# Patient Record
Sex: Female | Born: 1955 | Race: White | Hispanic: No | Marital: Married | Smoking: Never smoker
Health system: Southern US, Community
[De-identification: ages and names within clinical notes are randomized; demographics above are authoritative.]

## PROBLEM LIST (undated history)

## (undated) DIAGNOSIS — F4310 Post-traumatic stress disorder, unspecified: Secondary | ICD-10-CM

## (undated) DIAGNOSIS — G8929 Other chronic pain: Secondary | ICD-10-CM

## (undated) DIAGNOSIS — F32A Depression, unspecified: Secondary | ICD-10-CM

## (undated) DIAGNOSIS — T2125XA Burn of second degree of buttock, initial encounter: Secondary | ICD-10-CM

## (undated) DIAGNOSIS — E119 Type 2 diabetes mellitus without complications: Secondary | ICD-10-CM

## (undated) DIAGNOSIS — F429 Obsessive-compulsive disorder, unspecified: Secondary | ICD-10-CM

## (undated) DIAGNOSIS — F329 Major depressive disorder, single episode, unspecified: Secondary | ICD-10-CM

## (undated) DIAGNOSIS — I872 Venous insufficiency (chronic) (peripheral): Secondary | ICD-10-CM

## (undated) DIAGNOSIS — J45909 Unspecified asthma, uncomplicated: Secondary | ICD-10-CM

## (undated) DIAGNOSIS — F410 Panic disorder [episodic paroxysmal anxiety] without agoraphobia: Secondary | ICD-10-CM

## (undated) DIAGNOSIS — I10 Essential (primary) hypertension: Secondary | ICD-10-CM

## (undated) DIAGNOSIS — G5600 Carpal tunnel syndrome, unspecified upper limb: Secondary | ICD-10-CM

## (undated) DIAGNOSIS — G2581 Restless legs syndrome: Secondary | ICD-10-CM

## (undated) DIAGNOSIS — M1990 Unspecified osteoarthritis, unspecified site: Secondary | ICD-10-CM

## (undated) DIAGNOSIS — R161 Splenomegaly, not elsewhere classified: Secondary | ICD-10-CM

## (undated) DIAGNOSIS — Z87442 Personal history of urinary calculi: Secondary | ICD-10-CM

## (undated) DIAGNOSIS — E785 Hyperlipidemia, unspecified: Secondary | ICD-10-CM

## (undated) DIAGNOSIS — Z136 Encounter for screening for cardiovascular disorders: Secondary | ICD-10-CM

## (undated) HISTORY — DX: Restless legs syndrome: G25.81

## (undated) HISTORY — DX: Depression, unspecified: F32.A

## (undated) HISTORY — PX: OVARIAN CYST SURGERY: SHX726

## (undated) HISTORY — DX: Hyperlipidemia, unspecified: E78.5

## (undated) HISTORY — PX: HAND SURGERY: SHX662

## (undated) HISTORY — DX: Essential (primary) hypertension: I10

## (undated) HISTORY — PX: BREAST EXCISIONAL BIOPSY: SUR124

## (undated) HISTORY — PX: KNEE SURGERY: SHX244

## (undated) HISTORY — DX: Encounter for screening for cardiovascular disorders: Z13.6

## (undated) HISTORY — PX: TONSILLECTOMY: SUR1361

## (undated) HISTORY — DX: Panic disorder (episodic paroxysmal anxiety): F41.0

## (undated) HISTORY — DX: Major depressive disorder, single episode, unspecified: F32.9

---

## 1982-02-21 HISTORY — PX: ABDOMINAL HYSTERECTOMY: SHX81

## 1999-11-05 ENCOUNTER — Encounter: Payer: Self-pay | Admitting: Urology

## 1999-11-05 ENCOUNTER — Encounter: Admission: RE | Admit: 1999-11-05 | Discharge: 1999-11-05 | Payer: Self-pay | Admitting: Urology

## 2005-09-16 ENCOUNTER — Other Ambulatory Visit: Payer: Self-pay

## 2005-09-23 ENCOUNTER — Ambulatory Visit: Payer: Self-pay | Admitting: Specialist

## 2006-03-15 ENCOUNTER — Ambulatory Visit: Payer: Self-pay | Admitting: Endocrinology

## 2007-02-22 DIAGNOSIS — Z136 Encounter for screening for cardiovascular disorders: Secondary | ICD-10-CM

## 2007-02-22 HISTORY — PX: BREAST LUMPECTOMY: SHX2

## 2007-02-22 HISTORY — DX: Encounter for screening for cardiovascular disorders: Z13.6

## 2007-12-14 ENCOUNTER — Observation Stay: Payer: Self-pay | Admitting: Internal Medicine

## 2008-05-20 ENCOUNTER — Emergency Department: Payer: Self-pay | Admitting: Emergency Medicine

## 2009-07-15 DIAGNOSIS — E785 Hyperlipidemia, unspecified: Secondary | ICD-10-CM | POA: Insufficient documentation

## 2009-09-02 ENCOUNTER — Emergency Department: Payer: Self-pay | Admitting: Emergency Medicine

## 2009-11-10 DIAGNOSIS — M1990 Unspecified osteoarthritis, unspecified site: Secondary | ICD-10-CM | POA: Insufficient documentation

## 2009-12-21 DIAGNOSIS — F339 Major depressive disorder, recurrent, unspecified: Secondary | ICD-10-CM | POA: Insufficient documentation

## 2009-12-21 DIAGNOSIS — F429 Obsessive-compulsive disorder, unspecified: Secondary | ICD-10-CM | POA: Insufficient documentation

## 2009-12-21 DIAGNOSIS — F4310 Post-traumatic stress disorder, unspecified: Secondary | ICD-10-CM | POA: Insufficient documentation

## 2010-01-06 ENCOUNTER — Ambulatory Visit: Payer: Self-pay

## 2010-05-31 ENCOUNTER — Emergency Department: Payer: Self-pay | Admitting: Internal Medicine

## 2011-04-05 ENCOUNTER — Ambulatory Visit: Payer: Self-pay

## 2011-10-17 LAB — COMPREHENSIVE METABOLIC PANEL
Albumin: 4.3 g/dL (ref 3.4–5.0)
Alkaline Phosphatase: 122 U/L (ref 50–136)
Anion Gap: 13 (ref 7–16)
BUN: 20 mg/dL — ABNORMAL HIGH (ref 7–18)
Bilirubin,Total: 0.9 mg/dL (ref 0.2–1.0)
Calcium, Total: 9.3 mg/dL (ref 8.5–10.1)
Chloride: 100 mmol/L (ref 98–107)
Co2: 27 mmol/L (ref 21–32)
Creatinine: 0.84 mg/dL (ref 0.60–1.30)
EGFR (African American): >60
EGFR (Non-African Amer.): >60
Glucose: 93 mg/dL (ref 65–99)
Osmolality: 282 (ref 275–301)
Potassium: 3.5 mmol/L (ref 3.5–5.1)
SGOT(AST): 128 U/L — ABNORMAL HIGH (ref 15–37)
SGPT (ALT): 89 U/L — ABNORMAL HIGH (ref 12–78)
Sodium: 140 mmol/L (ref 136–145)
Total Protein: 7.9 g/dL (ref 6.4–8.2)

## 2011-10-17 LAB — TROPONIN I
Troponin-I: <0.02 ng/mL
Troponin-I: <0.02 ng/mL
Troponin-I: <0.02 ng/mL

## 2011-10-17 LAB — CBC
HCT: 43.9 % (ref 35.0–47.0)
HGB: 15.1 g/dL (ref 12.0–16.0)
MCH: 30.8 pg (ref 26.0–34.0)
MCHC: 34.4 g/dL (ref 32.0–36.0)
MCV: 90 fL (ref 80–100)
Platelet: 186 10*3/uL (ref 150–440)
RBC: 4.9 10*6/uL (ref 3.80–5.20)
RDW: 12.8 % (ref 11.5–14.5)
WBC: 12.4 10*3/uL — ABNORMAL HIGH (ref 3.6–11.0)

## 2011-10-17 LAB — LIPASE, BLOOD: Lipase: 232 U/L (ref 73–393)

## 2011-10-18 DIAGNOSIS — R079 Chest pain, unspecified: Secondary | ICD-10-CM

## 2011-10-18 LAB — TROPONIN I: Troponin-I: <0.02 ng/mL

## 2011-10-19 ENCOUNTER — Inpatient Hospital Stay: Payer: Self-pay | Admitting: Internal Medicine

## 2011-10-19 LAB — CBC WITH DIFFERENTIAL/PLATELET
Basophil %: 0.8 %
Eosinophil %: 4.6 %
HCT: 38.6 % (ref 35.0–47.0)
Lymphocyte #: 0.8 10*3/uL — ABNORMAL LOW (ref 1.0–3.6)
Lymphocyte %: 8.5 %
MCV: 90 fL (ref 80–100)
Monocyte %: 15.4 %
Neutrophil #: 6.8 10*3/uL — ABNORMAL HIGH (ref 1.4–6.5)
Platelet: 149 10*3/uL — ABNORMAL LOW (ref 150–440)
RBC: 4.3 10*6/uL (ref 3.80–5.20)
WBC: 9.6 10*3/uL (ref 3.6–11.0)

## 2011-10-19 LAB — COMPREHENSIVE METABOLIC PANEL
Albumin: 3.3 g/dL — ABNORMAL LOW (ref 3.4–5.0)
Alkaline Phosphatase: 220 U/L — ABNORMAL HIGH (ref 50–136)
Anion Gap: 10 (ref 7–16)
BUN: 7 mg/dL (ref 7–18)
Chloride: 105 mmol/L (ref 98–107)
Co2: 24 mmol/L (ref 21–32)
Creatinine: 0.71 mg/dL (ref 0.60–1.30)
Glucose: 114 mg/dL — ABNORMAL HIGH (ref 65–99)
Potassium: 3.5 mmol/L (ref 3.5–5.1)
SGOT(AST): 377 U/L — ABNORMAL HIGH (ref 15–37)
SGPT (ALT): 519 U/L — ABNORMAL HIGH (ref 12–78)
Total Protein: 6.6 g/dL (ref 6.4–8.2)

## 2011-10-19 LAB — BILIRUBIN, DIRECT: Bilirubin, Direct: 3.4 mg/dL — ABNORMAL HIGH (ref 0.00–0.20)

## 2011-10-20 LAB — PLATELET COUNT: Platelet: 141 10*3/uL — ABNORMAL LOW (ref 150–440)

## 2011-10-20 LAB — HEPATIC FUNCTION PANEL A (ARMC)
Albumin: 3.4 g/dL (ref 3.4–5.0)
Bilirubin,Total: 2 mg/dL — ABNORMAL HIGH (ref 0.2–1.0)
Total Protein: 6.8 g/dL (ref 6.4–8.2)

## 2011-10-21 LAB — CBC WITH DIFFERENTIAL/PLATELET
Basophil #: 0.1 10*3/uL (ref 0.0–0.1)
Eosinophil #: 0 10*3/uL (ref 0.0–0.7)
HCT: 40.5 % (ref 35.0–47.0)
Lymphocyte #: 1.1 10*3/uL (ref 1.0–3.6)
MCH: 32 pg (ref 26.0–34.0)
MCHC: 35.2 g/dL (ref 32.0–36.0)
MCV: 91 fL (ref 80–100)
Monocyte #: 1.6 x10 3/mm — ABNORMAL HIGH (ref 0.2–0.9)
Neutrophil #: 17.7 10*3/uL — ABNORMAL HIGH (ref 1.4–6.5)
RDW: 13.2 % (ref 11.5–14.5)

## 2011-10-21 LAB — HEPATIC FUNCTION PANEL A (ARMC)
Albumin: 3.4 g/dL (ref 3.4–5.0)
Alkaline Phosphatase: 214 U/L — ABNORMAL HIGH (ref 50–136)
Bilirubin,Total: 1.1 mg/dL — ABNORMAL HIGH (ref 0.2–1.0)
SGOT(AST): 155 U/L — ABNORMAL HIGH (ref 15–37)
SGPT (ALT): 336 U/L — ABNORMAL HIGH (ref 12–78)
Total Protein: 7.4 g/dL (ref 6.4–8.2)

## 2011-10-23 LAB — CULTURE, BLOOD (SINGLE)

## 2011-10-25 ENCOUNTER — Encounter: Payer: Self-pay | Admitting: Cardiovascular Disease

## 2011-10-25 LAB — CANCER ANTIGEN 19-9: CA 19-9: 125 U/mL — ABNORMAL HIGH (ref 0–35)

## 2011-10-25 LAB — PATHOLOGY REPORT

## 2011-11-24 ENCOUNTER — Encounter: Payer: Self-pay | Admitting: Cardiovascular Disease

## 2012-05-28 ENCOUNTER — Ambulatory Visit: Payer: Self-pay

## 2012-06-07 DIAGNOSIS — N200 Calculus of kidney: Secondary | ICD-10-CM | POA: Insufficient documentation

## 2012-07-11 ENCOUNTER — Ambulatory Visit: Payer: Self-pay

## 2012-07-26 ENCOUNTER — Emergency Department: Payer: Self-pay | Admitting: Emergency Medicine

## 2012-11-01 DIAGNOSIS — K219 Gastro-esophageal reflux disease without esophagitis: Secondary | ICD-10-CM | POA: Insufficient documentation

## 2013-05-21 DIAGNOSIS — G2581 Restless legs syndrome: Secondary | ICD-10-CM | POA: Insufficient documentation

## 2013-06-10 DIAGNOSIS — G5600 Carpal tunnel syndrome, unspecified upper limb: Secondary | ICD-10-CM | POA: Insufficient documentation

## 2013-07-24 DIAGNOSIS — G4700 Insomnia, unspecified: Secondary | ICD-10-CM | POA: Insufficient documentation

## 2013-08-04 DIAGNOSIS — F1120 Opioid dependence, uncomplicated: Secondary | ICD-10-CM | POA: Insufficient documentation

## 2013-08-04 DIAGNOSIS — F1320 Sedative, hypnotic or anxiolytic dependence, uncomplicated: Secondary | ICD-10-CM | POA: Insufficient documentation
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
## 2014-06-10 NOTE — H&P (Signed)
PATIENT NAME:  Laurie [name], [name] MR#:  [id number] DATE OF BIRTH:  December 04, 1955  DATE OF ADMISSION:  10/18/2011  REFERRING PHYSICIAN: Lowella FairyJohn Woodruff, MD  PRIMARY CARE PHYSICIAN: [facility]   CHIEF COMPLAINT: Chest pain, sweating, and shortness of breath.   HISTORY OF PRESENT ILLNESS: This is a 59 year old female with significant past medical history of hyperlipidemia, diet-controlled diabetes, depression, and panic attacks who presents with complaints of chest pain. The patient reports chest pain developed this afternoon with sudden onset which was constant, no relieving or provoking factors, as well as it was followed by nausea and vomiting x2, as well had episode of sweating and shortness of breath. The patient reports her chest pain is much improved at this point. The patient usually takes aspirin 81 mg daily. The patient was given 324 mg of aspirin in the ED. The patient had negative stress test done in 2009 for similar complaints of chest pain. The patient's EKG did not show any ST or T wave changes, was essentially normal. The patient had troponin negative x2. The patient had borderline d-dimer at 0.55. The patient has allergy to IV dye so CT chest angiogram to rule out PE could not be done. The patient was given one treatment dose of Lovenox in the ED until she is able to get a V/Q scan done in the a.m. to rule out PE.   PAST MEDICAL HISTORY:  1. Hyperlipidemia.  2. Diet-controlled diabetes mellitus.  3. Depression.  4. Panic attacks.  5. Restless leg syndrome.  6. Hypertension.   PAST SURGICAL HISTORY:  1. Left breast lumpectomy in 2009.  2. Left knee surgery.  3. Right hand surgery.  4. Heel spurs removed from her feet bilaterally.  5. Hysterectomy in 1984.  6. Tonsillectomy.   ALLERGIES: IV dye, sulfa drugs, and latex.   HOME MEDICATIONS: 1. Sertraline 100 mg 3 tablets oral daily.  2. Risperidone 2 mg oral at bedtime.  3. Risperidone 0.5 mg daily.  4. Pravastatin 40 mg  at bedtime.  5. Omeprazole 20 mg daily.  6. Fluoxetine 20 mg twice a day. 7. Clonazepam 1 mg daily at bedtime.  8. Aspirin 81 mg daily.  FAMILY HISTORY: Significant for coronary artery disease in her father's side, a brother has lymphoma.   SOCIAL HISTORY: Negative for tobacco, alcohol, or illicit drug use.   REVIEW OF SYSTEMS: The patient denies any fever, fatigue, or weakness. EYES: Denies blurry vision, double vision, or pain. ENT: Denies tinnitus, ear pain, or hearing loss. RESPIRATORY: Denies any cough, wheezing, or hemoptysis. Has complaints of mild dyspnea. Denies any asthma or chronic obstructive pulmonary disease. CARDIOVASCULAR: Complains of midsternal chest pain. Denies any orthopnea, edema, arrhythmia, palpitations, syncope, or altered mental status. GASTROINTESTINAL: Complains of nausea and vomiting. Denies any diarrhea, abdominal pain, hematemesis, or melena. GENITOURINARY: Denies dysuria, hematuria, or renal colic. ENDOCRINE: Denies polyuria, polydipsia, or heat or cold intolerance. HEMATOLOGY: Denies any anemia, easy bruising, or bleeding diathesis. INTEGUMENTARY: Denies any acne, rash, or itching. MUSCULOSKELETAL: Denies any neck pain, shoulder pain, knee pain, swelling, or gout. NEUROLOGIC: Denies any numbness, dysarthria, epilepsy, tremors, or vertigo. PSYCH: Has history of anxiety and depression. Denies schizophrenia.   PHYSICAL EXAMINATION:   VITAL SIGNS: Temperature 96.1, pulse 78, respiratory rate 20, blood pressure 138/69, and saturating 99% on room air.   GENERAL: Obese female who looks comfortable in bed, in no apparent distress.   HEENT: Head atraumatic, normocephalic. Pupils are equal and reactive to light. Pink conjunctivae. Anicteric sclerae. Moist  oral mucosa.   NECK: Supple. No thyromegaly. No JVD.   CHEST: Good air entry bilaterally. No wheezing, rales, or rhonchi. Has reproducible tenderness in the mid sternal chest wall.   CARDIOVASCULAR: S1 and S2 heard. No  rubs, murmurs, or gallops.   ABDOMEN: Soft, nontender, and nondistended. Bowel sounds present. Obese.   EXTREMITIES: No edema. No clubbing. No cyanosis.   PSYCHIATRIC: Appropriate affect. Awake and alert x3. Intact judgment and insight.   NEUROLOGIC: Cranial nerves grossly intact. Motor 5 out of 5 in all extremities. Sensation nonfocal and symmetrical reflexes nonfocal.   PERTINENT LABS: Glucose 93, BUN 20, creatinine 0.84, sodium 140, potassium 3.5, chloride 100, and CO2 27. Troponin less than 0.02 x2. White blood cells 12.4, hemoglobin 15.1, hematocrit 43.9, and platelets 186. D-dimer 0.55.  ASSESSMENT AND PLAN:  1. Chest pain. The patient had negative troponin x2 without any significant EKG changes and had a negative stress test four years ago. We have already given aspirin, already is on statin. We will cycle troponins, total of three times. We will cycle one more troponin, and if negative, the patient can followup with her PCP or she can have a stress test done as an outpatient, but given the fact the patient had a mildly elevated d-dimer and unfortunately has IV dye allergy where she cannot have CT angiogram done to rule out PE, I discussed with the patient we will have her started on Lovenox treatment dose anticoagulation until V/Q scan is done in the a.m. The patient is aware of the plan and agrees with it. 2. Hyperlipidemia. We will continue with statin.  3. History of diet-controlled diabetes mellitus. Currently the patient's glucose is within normal limits. We will have her on Accu-Cheks and we will check a glycohemoglobin.  4. History of hypertension. Blood pressure is acceptable. Currently the patient is not on any medication. We will monitor.  5. Deep vein thrombosis prophylaxis: The patient received a treatment dose of Lovenox in ED.  6. GI: Prophylaxis. We will continue with Protonix.   CODE STATUS: THE PATIENT IS FULL CODE.   TOTAL TIME SPENT ON ADMISSION AND PATIENT CARE: 50  minutes.  ____________________________ Starleen Arms, MD dse:slb D: 10/18/2011 01:16:16 ET T: 10/18/2011 07:26:42 ET JOB#: [id number]  cc: Starleen Arms, MD, <Dictator> PCP - [facility]  Dakin Madani Teena Irani MD ELECTRONICALLY SIGNED 10/18/2011 22:18

## 2014-06-10 NOTE — Consult Note (Signed)
PATIENT NAME:  Laurie [name], Laurie [name] MR#:  [id number] DATE OF BIRTH:  Dec 16, 1955  DATE OF CONSULTATION:  10/20/2011  REFERRING PHYSICIAN:  Hospitalist Service CONSULTING PHYSICIAN:  Lurline Del, MD  REASON FOR CONSULTATION: Noncardiac chest pain.   HISTORY OF PRESENT ILLNESS: This is a 59 year old female with history of hyperlipidemia, diabetes, depression, and panic attacks who was admitted with chest pain which appears to be pretty noncardiac in origin. The chest pain was sudden in onset and was associated with nausea and vomiting. Cardiac work-up so far has been negative. Ultrasound showed a thick-walled gallbladder consistent with cholecystitis and liver enzymes were abnormal and bile ducts were dilated. Gastroenterology was consulted for possible cholecystitis/choledocholithiasis as the cause of her noncardiac chest pain. The patient was evaluated yesterday. She has no further chest pain. She is complaining of some upper abdominal discomfort. No further nausea or vomiting has been reported.   PAST MEDICAL HISTORY:  1. Hyperlipidemia. 2. Diabetes. 3. Depression. 4. Panic attack. 5. Restless leg syndrome.  6. Hypertension.   PAST SURGICAL HISTORY:  1. Left breast lumpectomy. 2. Left knee surgery.  3. Right hand surgery. 4. Hysterectomy.  5. Tonsillectomy.   ALLERGIES: IV dye, sulfa, and latex.  HOME MEDICATIONS: Sertraline, risperidone, pravastatin, omeprazole, fluoxetine, clonazepam, and aspirin.   FAMILY HISTORY: Significant for coronary artery disease. Brother has lymphoma.   SOCIAL HISTORY: She does not smoke or drink.   REVIEW OF SYSTEMS: Grossly negative except for what is mentioned in the History of Present Illness.   PHYSICAL EXAMINATION:   GENERAL: Well built female, somewhat obese, does not appear to be in any acute distress.   VITAL SIGNS: Temperature is 97 and she has been afebrile since admission, except for low grade fever of 99.1. Heart rate is in the 70s and  80s. Blood pressure is about 100/60. Clinically she appears to be mildly jaundiced.   NECK: Neck veins are flat.   LUNGS: Grossly clear to auscultation bilaterally with fair air entry and no added sounds.   CARDIOVASCULAR: Regular rate and rhythm. No gallops or murmurs.   ABDOMEN: Soft. Bowel signs are positive. Mild epigastric tenderness was noted. No significant rebound or guarding was noted. No right upper quadrant tenderness was noted.   EXTREMITIES: No edema, clubbing, or cyanosis.   NEUROLOGIC: Examination appears to be unremarkable.   LABORATORY, DIAGNOSTIC AND RADIOLOGIC DATA: White cell count is 12.4, hemoglobin 15, and hematocrit 186. Repeat white cell count is normal at 9.6. Troponins have been negative. Two days ago her liver enzymes were unremarkable with an ALT of 89 and AST of 128. Yesterday liver enzymes were grossly abnormal with a bilirubin of 4.5, alkaline phosphatase of 220, ALT of 519, and AST 377. Today bilirubin is down to 2. Alkaline phosphatase is down to 218, ALT is down to 386, and AST is down to 193. D-dimer is slightly elevated at 0.55.   V/Q scan shows low probability.  Ultrasound of the abdomen showed edematous wall of the gallbladder. Common bile duct is enlarged. No definite intrahepatic biliary ductal dilatation was noted. No Murphy's sign. Trace fluid was seen adjacent to the gallbladder.   ASSESSMENT AND PLAN: The patient is with probable cholecystitis although, she has no fever, her white cell count is normal, and no significant Murphy's sign was noted on examination. The patient also has distended common bile duct for which a MRCP was requested, which turned out to be negative for any stones, although the common bile duct does appear to be somewhat  enlarged at about 9 mm. Mild prominence of the pancreatic duct was noted as well, although no discrete pancreatic masses were noted. Due to the patient's intravenous pyelogram dye allergy an ERCP was not done and a  MRCP was done instead. The patient is currently awaiting cholecystectomy. I believe that an intraoperative cholangiogram will be done as well. We will wait for those results. I will obtain a CA-19-9 and the patient will eventually require a CT scan of the abdomen to rule out any pancreatic lesion as a cause of her biliary and pancreatic duct dilatation. Further recommendations to follow. Continue antibiotics. We will follow.  ____________________________ Lurline DelShaukat Aliena Ghrist, MD si:slb D: 10/20/2011 18:36:14 ET T: 10/21/2011 07:47:47 ET JOB#: [id number]  cc: Lurline DelShaukat Mildred Bollard, MD, <Dictator> Lurline DelSHAUKAT Juddson Cobern MD ELECTRONICALLY SIGNED 11/02/2011 11:02

## 2014-06-10 NOTE — Consult Note (Signed)
PATIENT NAME:  Laurie [name], Laurie [name] MR#:  [id number] DATE OF BIRTH:  1955/12/24  DATE OF CONSULTATION:  10/18/2011  REFERRING PHYSICIAN:  Katharina Caperima Vaickute, MD CONSULTING PHYSICIAN:  Emeree Mahler A. Kirke CorinArida, MD  REASON FOR CONSULTATION: Chest pain.   HISTORY OF PRESENT ILLNESS: This is a 59 year old Caucasian female with past medical history of hyperlipidemia, diet-controlled diabetes, depression, and panic attacks. She presented with sudden onset of chest pain which happened yesterday. It was substernal and described as sharp and was worse with a deep breath. It was associated with nausea and vomiting as well as sweating and shortness of breath. The patient currently feels better. She had chest pain in 2009 and was evaluated by a stress test. It was normal at that time. She has been noticing increased exertional dyspnea over the last two months. Her cardiac enzymes are negative during this admission. ECG showed no acute changes. D-dimer was mildly elevated. She is allergic to the IV dye and thus CT angiogram was not performed. She is waiting to have a V/Q scan done today.   PAST MEDICAL HISTORY:  1. Hyperlipidemia.  2. Diet-controlled diabetes.  3. Depression.  4. Panic attack.  5. Restless leg syndrome.  6. Hypertension.  7. Negative stress test in 2009.   ALLERGIES: IV dye, sulfa drugs, and latex.   HOME MEDICATIONS:  1. Sertraline 300 mg once daily.  2. Risperidone 2 mg at bedtime and 0.5 mg daily.  3. Pravastatin.  4. Omeprazole.  5. Fluoxetine.  6. Clonazepam.  7. Aspirin.   FAMILY HISTORY: Her father had a myocardial infarction.   SOCIAL HISTORY: Negative for smoking, alcohol, or recreational drug use.   REVIEW OF SYSTEMS: A ten-point review of systems was performed. It is negative other than what is mentioned in the history of present illness.   PHYSICAL EXAMINATION:   GENERAL: The patient appears to be at her stated age, in no acute distress.   VITAL SIGNS: Temperature 96.1, pulse  78, respiratory rate 20, blood pressure 138/69, and oxygen saturation is 99% on room air.   HEENT: Normocephalic, atraumatic.   NECK: No jugular venous distention or carotid bruits.   RESPIRATORY: Normal respiratory effort with no use of accessory muscles. Auscultation reveals normal breath sounds.   CARDIOVASCULAR: Normal PMI. Normal S1 and S2 with no gallops or murmurs.   ABDOMEN: Benign, nontender, and nondistended.   EXTREMITIES: No clubbing, cyanosis, or edema.   SKIN: Warm and dry with no rash.   PSYCHIATRIC: She is alert and oriented x3 with normal mood and affect.   LABORATORY AND DIAGNOSTIC DATA: Her labs were unremarkable, except for slightly elevated white cell count at 12.4. D-dimer was 0.55. Cardiac enzymes are negative.   ECG showed sinus rhythm with no significant ST or T wave changes.   IMPRESSION:  1. Pleuritic chest pain with dyspnea and mildly elevated d-dimer level.  2. Hypertension.  3. Hyperlipidemia.   RECOMMENDATIONS: The patient's chest pain is pleuritic in nature and does not seem to be angina. Given her associated symptoms and mildly elevated d-dimer level, I agree with evaluation for pulmonary embolism with V/Q scan. I will also obtain an echocardiogram to look for other cardiac etiologies. If the above work-up is negative with no other medical issues, the patient can likely be discharged home from a cardiac standpoint with plans for an outpatient treadmill stress test. Aggressive treatment of her risk factors is recommended.  ____________________________ Chelsea AusMuhammad A. Kirke CorinArida, MD maa:slb D: 10/18/2011 09:50:56 ET T: 10/18/2011 10:11:51 ET JOB#:  [id number]  cc: Lakie Mclouth A. Kirke Corin, MD, <Dictator> Iran Ouch MD ELECTRONICALLY SIGNED 11/25/2011 13:14

## 2014-06-10 NOTE — Consult Note (Signed)
Chief Complaint:   Subjective/Chief Complaint Doing fairly well after surgery. Passing flatus.   VITAL SIGNS/ANCILLARY NOTES: **Vital Signs.:   30-Aug-13 05:22   Vital Signs Type Routine   Temperature Temperature (F) 98.2   Celsius 36.7   Temperature Source Oral   Pulse Pulse 85   Respirations Respirations 18   Systolic BP Systolic BP 117   Diastolic BP (mmHg) Diastolic BP (mmHg) 73   Mean BP 87   Pulse Ox % Pulse Ox % 94   Pulse Ox Activity Level  At rest   Oxygen Delivery Room Air/ 21 %   Lab Results: Hepatic:  30-Aug-13 05:10    Bilirubin, Total  1.1   Bilirubin, Direct  0.5 (Result(s) reported on 21 Oct 2011 at 05:47AM.)   Alkaline Phosphatase  214   SGPT (ALT)  336   SGOT (AST)  155   Total Protein, Serum 7.4   Albumin, Serum 3.4  Routine Hem:  30-Aug-13 05:10    WBC (CBC)  20.5   RBC (CBC) 4.44   Hemoglobin (CBC) 14.2   Hematocrit (CBC) 40.5   Platelet Count (CBC) 200   MCV 91   MCH 32.0   MCHC 35.2   RDW 13.2   Neutrophil % 86.0   Lymphocyte % 5.5   Monocyte % 8.0   Eosinophil % 0.1   Basophil % 0.4   Neutrophil #  17.7   Lymphocyte # 1.1   Monocyte #  1.6   Eosinophil # 0.0   Basophil # 0.1 (Result(s) reported on 21 Oct 2011 at 05:40AM.)   Assessment/Plan:  Assessment/Plan:   Assessment Cholecystitis. No evidence of choledocholithiasis. Elevated LFT's improving.    Plan Agree with Dr. Molinda BailiffBird's plan. Will sign off and see her as OP (orders written).   Electronic Signatures: Lurline DelIftikhar, Kerstie Agent (MD)  (Signed [phone]-Aug-13 08:47)  Authored: Chief Complaint, VITAL SIGNS/ANCILLARY NOTES, Lab Results, Assessment/Plan   Last Updated: 30-Aug-13 08:47 by Lurline DelIftikhar, Cardell Rachel (MD)

## 2014-06-10 NOTE — Discharge Summary (Signed)
PATIENT NAME:  Laurie [name], Laurie [name] MR#:  [id number] DATE OF BIRTH:  1955/10/22  DATE OF ADMISSION:  10/18/2011 DATE OF DISCHARGE:  10/21/2011  ADMITTING DIAGNOSIS: Chest pain.   DISCHARGE DIAGNOSES:  1. Chest pain due to acute cholecystitis status post laparoscopic cholecystectomy on 10/20/2011 by Dr. Egbert Garibaldi. 2. Mildly elevated blood pressure, resolved, likely due to pain.  3. History of hyperlipidemia. 4. Diabetes mellitus, diet controlled. 5. Depression. 6. Panic attacks. 7. Restless leg syndrome.  DISCHARGE CONDITION: Stable.   DISCHARGE MEDICATIONS:  1. Clonazepam 1 mg p.o. daily. 2. Fluoxetine 20 mg p.o. twice daily. 3. Omeprazole 20 mg p.o. daily. 4. Pravastatin 40 mg at bedtime. 5. Sertraline 100 mg p.o. daily. 6. Risperidone 0.5 mg p.o. daily. 7. Risperidone 2 mg at bedtime. 8. Acetaminophen/oxycodone 325/5 mg one tablet every 4 to 6 hours as needed.   NOTE: She has not to take aspirin 81 mg p.o. daily until recommended by primary care physician.   DIET: 2 grams salt, low fat, low cholesterol, carbohydrate-controlled diet, regular consistency.   ACTIVITY LIMITATIONS: As tolerated.   DISCHARGE FOLLOWUP: Followup with Dr. Eldridge Dace in 1 to 2 days after discharge for outpatient stress test as well as [facility] primary care physician in two days after discharge for further evaluation.  CONSULTANTS:  1. Lorine Bears, MD. 2. Lurline Del, MD. 3. Natale Lay, MD.  RADIOLOGIC STUDIES: Chest x-ray, PA and lateral, on 10/17/2011, revealed no acute cardiopulmonary disease.  Nuclear medicine V/Q scan test on 10/18/2011 showed low probability for pulmonary embolus.   Ultrasound of the abdomen, limited survey, on 10/19/2011, showed abnormal appearance of gallbladder concerning for cholecystitis. No sonographic Murphy's sign. Gallbladder wall is thickened. Common bile duct is distended. Correlate with clinical and laboratory data. Surgical consultation may be  beneficial. No gallstones were demonstrated.   MRCP on 10/20/2011 showed common bile duct dilatation without evidence of filling defects reflecting gallstones. Distended gallbladder and pericholecystic fluid was noted. These findings may represent sequela of cholecystitis if clinically appropriate.  HISTORY:  The patient is to 59 year old Caucasian female with past medical history significant for history of hyperlipidemia, diabetes mellitus, depression, and panic attacks who presented to the hospital with complaints of chest pain. Please refer to Dr. Mliss Fritz Elgergawy's admission on 10/18/2011.   On admission to the hospital, the patient's temperature is 96.1, pulse 78, respiratory rate 20, blood pressure 138/69, and saturation was 99% on room air. Physical examination was unremarkable.   LABORATORY, DIAGNOSTIC AND RADIOLOGIC DATA: Labs showed a normal BMP,  except mildly elevated BUN of 20. The patient's liver enzymes showed elevated AST and ALT of 128 and 89, respectively. Cardiac enzymes, first set, as well as subsequent two more sets were within normal limits. White blood cell count was up to 12.4, hemoglobin 15.1, and platelet count 186. D-dimer was slightly elevated at 0.55.   EKG showed normal sinus rhythm at 80 beats per minute, normal axis, and no acute ST-T changes.   Hemoglobin A1c was 6.3.   HOSPITAL COURSE: The patient was admitted to the hospital for further evaluation. Initially, because of her chest pain, consultation with Dr. Kirke Corin was obtained. Dr. Kirke Corin felt that the patient's chest pain is pleuritic with some dyspnea as well as elevated d-dimer. He did not feel that it was angina. Because of her symptoms as well as mildly elevated d-dimer, he recommended to rule out pulmonary embolism with V/Q scan. He also recommended to get echocardiogram to look for cardiac etiologies of her problems.  Echocardiogram was performed on 10/18/2011. It showed normal left ventricular function and  ejection fraction of more than 55%. Transmitral spectral Doppler flow pattern was suggestive of impaired left ventricular relaxation and borderline right ventricular enlargement was noted as well as right ventricular systolic function was normal with trace tricuspid regurgitation. Doppler findings did not suggest pulmonary hypertension. Because of this, Dr. Kirke CorinArida felt that the patient has very low risk of cardiac problems. However, he recommended outpatient stress test. Because the patient was still complaining of chest pain, the patient was reassessed and was felt to have discomfort in the abdominal area, right upper quadrant of abdomen, so ultrasound of abdomen was performed which showed possible cholecystitis and consultations with Dr. Egbert GaribaldiBird as well as Dr. Niel HummerIftikhar were obtained. Dr. Egbert GaribaldiBird saw the patient in consultation, on 10/19/2011. He felt that the patient has acute cholecystitis as well as possible cholelithiasis. He recommended to get MRCP and then if it is negative get laparoscopic cholecystectomy and intraoperative cholangiogram. If however it is positive, then the patient would need to have ERCP likely done followed by cholecystectomy. As the patient's MRCP was negative, the patient was taken to the operating room on 10/20/2011 and underwent laparoscopic cholecystectomy under general anesthesia. Postoperatively she did well with no significant discomfort. Her symptoms regressed and for the mild discomfort and pain she has she is continue pain medications. She is being discharged in stable condition with the above-mentioned medications and follow-up. She is to follow up with Dr. Egbert GaribaldiBird in the next one week after discharge for further recommendations. Dr. Niel HummerIftikhar, who saw the patient in consultation, also ordered CA-19-9 the results of which just came in and are abnormal. It is elevated at 125. This needs to be followed up and Dr. Lurline DelShaukat Iftikhar felt the patient needed to be evaluated for possible  pancreatic malignancy. Dr. Lurline DelShaukat Iftikhar is to followup with her as an outpatient in the next one week after discharge as well.   As mentioned above, the patient is being discharged in stable condition with the above-mentioned medications and follow-up. Her vital signs on the day of discharge include a    temperature of 98.2, pulse 85, respiration rate 18, blood pressure 103/73, and saturation 94% on room air at rest   TIME SPENT: 40 minutes.  ____________________________ Katharina Caperima Mahdi Frye, MD rv:slb D: 10/22/2011 15:03:28 ET T: 10/25/2011 11:32:16 ET JOB#: [id number]  cc: Katharina Caperima Markos Theil, MD, <Dictator> [facility]  Percilla Tweten MD ELECTRONICALLY SIGNED 11/02/2011 22:11

## 2014-06-10 NOTE — Consult Note (Signed)
Chief Complaint:   Subjective/Chief Complaint Overall better. MRCP showed distended CBD without filling defects. Bilirubin better at 2. Agree with CCY and ? IOC. Will order CA 19-9 due to double duct sign and no stones. Will follow results of surgery.   Electronic Signatures: Lurline DelIftikhar, Risha Barretta (MD)  (Signed 29-Aug-13 18:29)  Authored: Chief Complaint   Last Updated: 29-Aug-13 18:29 by Lurline DelIftikhar, Wyndell Cardiff (MD)

## 2014-06-10 NOTE — Consult Note (Signed)
PATIENT NAME:  Laurie [name], Laurie [name] MR#:  [id number] DATE OF BIRTH:  [birth year]  DATE OF CONSULTATION:  10/19/2011  REFERRING PHYSICIAN:   CONSULTING PHYSICIAN:  Teneshia Hedeen A. Egbert Garibaldi, MD  REASON FOR CONSULTATION: Evaluation of abdominal pain and possible cholecystitis.   HISTORY: This is a 59 year old white female with history of hyperlipidemia, diet controlled diabetes, depression, panic attacks, and obesity who presented to the Emergency Room with right upper quadrant abdominal pain radiating into her chest. The patient also has had nausea and vomiting on two occasions. She had some shortness of breath with this. She was admitted to the medical service. Cardiac and pulmonary work-up have been negative. The patient has an IV dye allergy. The patient initially had liver function tests that were normal. Repeat liver function tests this morning demonstrated them to be significantly deranged with hyperbilirubinemia and transaminopathy. In relating her history, the patient states that she's had an approximately four year history of intermittent postprandial right upper quadrant and epigastric abdominal pain associated with mild intermittent nausea and some vomiting for the last four years. It has never become this severe. As such, Gastroenterology has been consulted and plan is for MRCP tomorrow. Surgical services have been consulted.   PAST MEDICAL HISTORY:  1. Hyperlipidemia. 2. Diet controlled diabetes. 3. Depression. 4. Panic attacks. 5. Restless leg syndrome.   6. Hypertension.   PAST SURGICAL HISTORY:  1. Left breast lumpectomy in 2009.  2. Left knee surgery.  3. Right hand surgery.  4. Heel surgery on feet bilaterally. 5. Hysterectomy through a Pfannenstiel incision in 1984. 6. Tonsillectomy.   ALLERGIES: IV dye, sulfa drugs, and latex.   HOME MEDICATIONS:  1. Sertraline 100 mg by mouth 3 times a day.  2. Risperidone 2 mg by mouth at bedtime.  3. Risperidone 0.5 mg daily. 4. Pravastatin 40 mg  by mouth at bedtime.  5. Omeprazole 20 mg by mouth daily.  6. Fluoxetine 20 mg by mouth b.i.d.  7. Clonazepam 1 mg by mouth at bedtime.  8. Aspirin 81 mg by mouth once a day.   FAMILY HISTORY: Significant for coronary artery disease and lymphoma as well as cholecystitis and cholelithiasis requiring cholecystectomy in her mother.   SOCIAL HISTORY: The patient does not work. Does not smoke, drink, or use IV drugs.  REVIEW OF SYSTEMS: Significant for nausea, vomiting, and abdominal pain as described above. Intermittent postprandial abdominal pain and dyspepsia. No jaundice previously. No fever. No sick contacts. Remaining 10 point review otherwise negative.   PHYSICAL EXAMINATION:   GENERAL: The patient is mildly jaundiced on examination of her scleral icterus.   NECK: Supple. No adenopathy.  VITAL SIGNS: Temperature 98.1, pulse 65, respiratory rate 18, blood pressure 100/65.   LUNGS: Clear.   HEART: Regular rate and rhythm.   ABDOMEN: Tender in the right upper quadrant to deep palpation consistent with a Murphy's sign. No obvious hernias. No masses.   SKIN: Warm and well perfused with mild icterus.   NEUROLOGIC: Normal.   PSYCHIATRIC: Appropriate mood, judgment, and affect.  CONSTITUTIONAL: The patient is well nourished, mildly obese.   LABORATORY VALUES: Total bilirubin 4.5, direct fraction 3.4, alkaline phosphatase 220, AST 377, ALT 512, white count 9.6, hemoglobin 13.6, platelet count 149,000, glucose 114, BUN 7, creatinine 0.71, sodium 139, potassium 3.5, chloride 105, CO2 24, total calcium 8.6.   REVIEW OF STUDIES: Ultrasound of the right upper quadrant demonstrates gallbladder wall thickening with pericholecystic fluid. No obvious stones. Bile duct was 7.3 mm in size.  V/Q scan was low probability.  Chest x-ray, PA and lateral, demonstrates no evidence of acute cardiopulmonary disease.   IMPRESSION: This patient has acute cholecystitis and possible choledocholithiasis.    RECOMMENDATIONS: I agree with MRCP and GI Medicine evaluation. If MRCP is negative, the patient will need to be premedicated for IV dye allergy and then undergo cholecystectomy with intraoperative cholangiography. If MRCP is positive for choledochus, the patient will require an ERCP followed by elective laparoscopic cholecystectomy. I will have Dr. Juliann PulseLundquist check on her tomorrow.   TOTAL TIME SPENT: 45 minutes. All of her questions were answered.   ____________________________ Redge GainerMark A. Egbert GaribaldiBird, MD mab:drc D: 10/19/2011 22:35:46 ET T: 10/20/2011 09:40:41 ET JOB#: [id number]  cc: Loraine LericheMark A. Egbert GaribaldiBird, MD, <Dictator> Raynald KempMARK A Adisyn Ruscitti MD ELECTRONICALLY SIGNED 10/20/2011 14:18

## 2014-06-10 NOTE — Consult Note (Signed)
Brief Consult Note: Diagnosis: Cholecystitis. ? Choledocholithiasis.   Patient was seen by consultant.   Comments: Recommendations: IV antibiotics. MRCP in am to evaluate bile duct (patient is allergic to IVP dye and therefore will avoid ERCP if possible). Surgical consult. Will follow.  Electronic Signatures: Lurline DelIftikhar, Vickie Ponds (MD)  (Signed [phone]-Aug-13 18:27)  Authored: Brief Consult Note   Last Updated: 28-Aug-13 18:27 by Lurline DelIftikhar, Trevione Wert (MD)

## 2014-06-10 NOTE — Op Note (Signed)
PATIENT NAME:  Laurie [name], Laurie [name] MR#:  [id number] DATE OF BIRTH:  1955/05/19  DATE OF PROCEDURE:  10/20/2011  PREOPERATIVE DIAGNOSIS: Acute calculus cholecystitis.   POSTOPERATIVE DIAGNOSIS: Acute calculus cholecystitis.   PROCEDURE PERFORMED: Laparoscopic cholecystectomy.   SURGEON: Titania Gault A. Egbert [name], M.D.   ASSISTANT: Surgical scrub technologist.  ANESTHESIA: General endotracheal - [facility]   FINDINGS: Acute cholecystitis.   SPECIMENS: Gallbladder with contents.   DESCRIPTION OF PROCEDURE: With the patient in the supine position, general oral endotracheal anesthesia was induced. The patient's left arm was padded and tucked at her side. Her abdomen was widely clipped and prepped utilizing ChloraPrep solution. Time out was observed. A 12 mm blunt Hassan trocar was placed through an open technique through an infraumbilical transverse skin incision with stay sutures being passed through the fascia. Pneumoperitoneum was established to 15 mmHg and then later to 17 mmHg during the case with adequate exposure. There were some omental adhesions to the undersurface of the abdominal wall which were taken down with finger dissection. The patient was then positioned in reverse Trendelenburg and airplane right side up. A 5 mm Bladeless trocar was placed in the epigastrium, two 5 mm bladeless trocars in the right subcostal margin. The camera was then switched to a 5 mm camera and inspection down to the midline port demonstrated no evidence of bowel injury. The gallbladder was tensely distended and aspirated approximately 60 to 70 mL of thick motor-oil bile. The gallbladder was grasped along its fundus and elevated towards the right shoulder. Lateral traction was placed on Hartmann's pouch. The hepatoduodenal ligament was then incised with blunt technique and then a cystic duct was identified along with an anterior and posterior branch of the cystic artery. The cystic artery was doubly clipped  on the portal side, singly clipped on the gallbladder side and divided with sharp scissors. A Kumar clamp for cholangiography was placed across the gallbladder neck and attempt at fluoroscopy was unsuccessful due to patient positioning on the table. As this was abandoned, the cystic duct was then triply clipped on the portal side, singly clipped on the gallbladder side and divided. The gallbladder was then retrieved off the gallbladder fossa utilizing hook cautery apparatus and placed into an Endo Catch device. A small amount of bile was spilled during this and immediately aspirated. With the specimen retrieved, the right upper quadrant was irrigated with a total of 2 liters of normal saline and aspirated dry and point hemostasis was obtained with point cautery. A 19 mm Blake drain was directed into the space and exited through the lowermost right upper quadrant port site. Drain site was secured with 3-0 nylon suture. The drain was directed into Morison's pouch. With hemostasis being ensured on the operative field, ports were then removed under direct visualization, pneumoperitoneum was released, and the infraumbilical fascial defect was closed with a figure-of-eight vertically oriented #0 Vicryl suture. The existing stay sutures were tied to each other. A total of 30 mL of 0.25% plain Marcaine was infiltrated along all skin and fascial incisions prior to closure. The umbilical skin incision was closed utilizing interrupted 4-0 nylon sutures. A 4-0 Vicryl was used to reapproximate skin edges and the remaining port sites. Steri-Strips and occlusive sterile dressings were then placed and the patient was then subsequently extubated and taken to the recovery room in stable and satisfactory condition by anesthesia services.  ____________________________ Redge GainerMark A. Egbert GaribaldiBird, MD mab:slb D: 10/20/2011 23:50:53 ET T: 10/21/2011 08:29:16 ET JOB#: [id number]  cc: Laurie LericheMark  Kela Millin, MD, <Dictator> Raynald Kemp MD ELECTRONICALLY SIGNED  10/21/2011 18:19

## 2014-06-10 NOTE — Consult Note (Signed)
Brief Consult Note: Diagnosis: Pleuretic chest pain, mildly elevated D-dimer.   Patient was seen by consultant.   Consult note dictated.   Discussed with Attending MD.   Comments: Agree with V-Q scan.  I will obtain an echo.  If work up negative, can obtain an outpatient treadmil stress test.  Electronic Signatures: Lorine BearsArida, Muhammad (MD)  (Signed 27-Aug-13 09:46)  Authored: Brief Consult Note   Last Updated: 27-Aug-13 09:46 by Lorine BearsArida, Muhammad (MD)

## 2014-06-10 NOTE — Consult Note (Signed)
Brief Consult Note: Diagnosis: acute cholecystitis, possible choledocholithiasis, IV dye allergy.   Patient was seen by consultant.   Consult note dictated.   Recommend further assessment or treatment.   Comments: MRCP Thursday, if negative then pre-med for IV dye allergy and then needs Lap CCY and IOC, If positive then ERCP followed by CCY.  Will follow.  Dr. Juliann PulseLundquist will see Thursday following MRCP results.  Electronic Signatures: Natale LayBird, Mylynn Dinh (MD)  (Signed [phone]-Aug-13 22:24)  Authored: Brief Consult Note   Last Updated: 28-Aug-13 22:24 by Natale LayBird, Braydyn Schultes (MD)

## 2014-09-21 ENCOUNTER — Encounter: Payer: Self-pay | Admitting: Emergency Medicine

## 2014-09-21 ENCOUNTER — Emergency Department: Payer: No Typology Code available for payment source

## 2014-09-21 ENCOUNTER — Emergency Department
Admission: EM | Admit: 2014-09-21 | Discharge: 2014-09-21 | Disposition: A | Discharge status: Home / Self Care | Payer: No Typology Code available for payment source | Attending: Emergency Medicine | Admitting: Emergency Medicine

## 2014-09-21 DIAGNOSIS — I10 Essential (primary) hypertension: Secondary | ICD-10-CM | POA: Insufficient documentation

## 2014-09-21 DIAGNOSIS — S0990XA Unspecified injury of head, initial encounter: Secondary | ICD-10-CM | POA: Insufficient documentation

## 2014-09-21 DIAGNOSIS — S161XXA Strain of muscle, fascia and tendon at neck level, initial encounter: Secondary | ICD-10-CM | POA: Diagnosis not present

## 2014-09-21 DIAGNOSIS — Y92192 Bathroom in other specified residential institution as the place of occurrence of the external cause: Secondary | ICD-10-CM | POA: Insufficient documentation

## 2014-09-21 DIAGNOSIS — S199XXA Unspecified injury of neck, initial encounter: Secondary | ICD-10-CM | POA: Diagnosis present

## 2014-09-21 DIAGNOSIS — W01198A Fall on same level from slipping, tripping and stumbling with subsequent striking against other object, initial encounter: Secondary | ICD-10-CM | POA: Insufficient documentation

## 2014-09-21 DIAGNOSIS — Z7982 Long term (current) use of aspirin: Secondary | ICD-10-CM | POA: Diagnosis not present

## 2014-09-21 DIAGNOSIS — Y998 Other external cause status: Secondary | ICD-10-CM | POA: Diagnosis not present

## 2014-09-21 DIAGNOSIS — Z79899 Other long term (current) drug therapy: Secondary | ICD-10-CM | POA: Insufficient documentation

## 2014-09-21 DIAGNOSIS — Y92009 Unspecified place in unspecified non-institutional (private) residence as the place of occurrence of the external cause: Secondary | ICD-10-CM

## 2014-09-21 DIAGNOSIS — E119 Type 2 diabetes mellitus without complications: Secondary | ICD-10-CM | POA: Insufficient documentation

## 2014-09-21 DIAGNOSIS — S069X9A Unspecified intracranial injury with loss of consciousness of unspecified duration, initial encounter: Secondary | ICD-10-CM

## 2014-09-21 DIAGNOSIS — Z91040 Latex allergy status: Secondary | ICD-10-CM | POA: Insufficient documentation

## 2014-09-21 DIAGNOSIS — Y9389 Activity, other specified: Secondary | ICD-10-CM | POA: Diagnosis not present

## 2014-09-21 DIAGNOSIS — S069X1A Unspecified intracranial injury with loss of consciousness of 30 minutes or less, initial encounter: Secondary | ICD-10-CM

## 2014-09-21 DIAGNOSIS — W19XXXA Unspecified fall, initial encounter: Secondary | ICD-10-CM

## 2014-09-21 MED ORDER — CYCLOBENZAPRINE HCL 5 MG PO TABS: 5.0000 mg | ORAL_TABLET | Freq: Three times a day (TID) | ORAL | Status: DC | PRN

## 2014-09-21 MED ORDER — TRAMADOL HCL 50 MG PO TABS: 50.0000 mg | ORAL_TABLET | Freq: Two times a day (BID) | ORAL | Status: DC

## 2014-09-21 MED ORDER — CYCLOBENZAPRINE HCL 10 MG PO TABS
10.0000 mg | ORAL_TABLET | Freq: Once | ORAL | Status: AC
  Administered 2014-09-21: 10 mg via ORAL
  Filled 2014-09-21: qty 1

## 2014-09-21 MED ORDER — TRAMADOL HCL 50 MG PO TABS
100.0000 mg | ORAL_TABLET | Freq: Once | ORAL | Status: DC
  Filled 2014-09-21: qty 2

## 2014-09-21 NOTE — Discharge Instructions (Signed)
Cervical Sprain A cervical sprain is when the tissues (ligaments) that hold the neck bones in place stretch or tear. HOME CARE   Put ice on the injured area.  Put ice in a plastic bag.  Place a towel between your skin and the bag.  Leave the ice on for 15-20 minutes, 3-4 times a day.  You may have been given a collar to wear. This collar keeps your neck from moving while you heal.  Do not take the collar off unless told by your doctor.  If you have long hair, keep it outside of the collar.  Ask your doctor before changing the position of your collar. You may need to change its position over time to make it more comfortable.  If you are allowed to take off the collar for cleaning or bathing, follow your doctor's instructions on how to do it safely.  Keep your collar clean by wiping it with mild soap and water. Dry it completely. If the collar has removable pads, remove them every 1-2 days to hand wash them with soap and water. Allow them to air dry. They should be dry before you wear them in the collar.  Do not drive while wearing the collar.  Only take medicine as told by your doctor.  Keep all doctor visits as told.  Keep all physical therapy visits as told.  Adjust your work station so that you have good posture while you work.  Avoid positions and activities that make your problems worse.  Warm up and stretch before being active. GET HELP IF:  Your pain is not controlled with medicine.  You cannot take less pain medicine over time as planned.  Your activity level does not improve as expected. GET HELP RIGHT AWAY IF:   You are bleeding.  Your stomach is upset.  You have an allergic reaction to your medicine.  You develop new problems that you cannot explain.  You lose feeling (become numb) or you cannot move any part of your body (paralysis).  You have tingling or weakness in any part of your body.  Your symptoms get worse. Symptoms include:  Pain,  soreness, stiffness, puffiness (swelling), or a burning feeling in your neck.  Pain when your neck is touched.  Shoulder or upper back pain.  Limited ability to move your neck.  Headache.  Dizziness.  Your hands or arms feel week, lose feeling, or tingle.  Muscle spasms.  Difficulty swallowing or chewing. MAKE SURE YOU:   Understand these instructions.  Will watch your condition.  Will get help right away if you are not doing well or get worse. Document Released: 07/27/2007 Document Revised: 10/10/2012 Document Reviewed: 08/15/2012 [facility] Patient Information 2015 [place], [place]. This information is not intended to replace advice given to you by your health care provider. Make sure you discuss any questions you have with your health care provider.  Concussion A concussion is a brain injury. It is caused by:  A hit to the head.  A quick and sudden movement (jolt) of the head or neck. A concussion is usually not life threatening. Even so, it can cause serious problems. If you had a concussion before, you may have concussion-like problems after a hit to your head. HOME CARE General Instructions  Follow your doctor's directions carefully.  Take medicines only as told by your doctor.  Only take medicines your doctor says are safe.  Do not drink alcohol until your doctor says it is okay. Alcohol and some drugs  can slow down healing. They can also put you at risk for further injury.  If you are having trouble remembering things, write them down.  Try to do one thing at a time if you get distracted easily. For example, do not watch TV while making dinner.  Talk to your family members or close friends when making important decisions.  Follow up with your doctor as told.  Watch your symptoms. Tell others to do the same. Serious problems can sometimes happen after a concussion. Older adults are more likely to have these problems.  Tell your teachers, school nurse, school  counselor, coach, Event organiser, or work Production designer, theatre/television/film about your concussion. Tell them about what you can or cannot do. They should watch to see if:  It gets even harder for you to pay attention or concentrate.  It gets even harder for you to remember things or learn new things.  You need more time than normal to finish things.  You become annoyed (irritable) more than before.  You are not able to deal with stress as well.  You have more problems than before.  Rest. Make sure you:  Get plenty of sleep at night.  Go to sleep early.  Go to bed at the same time every day. Try to wake up at the same time.  Rest during the day.  Take naps when you feel tired.  Limit activities where you have to think a lot or concentrate. These include:  Doing homework.  Doing work related to a job.  Watching TV.  Using the computer. Returning To Your Regular Activities Return to your normal activities slowly, not all at once. You must give your body and brain enough time to heal.   Do not play sports or do other athletic activities until your doctor says it is okay.  Ask your doctor when you can drive, ride a bicycle, or work other vehicles or machines. Never do these things if you feel dizzy.  Ask your doctor about when you can return to work or school. Preventing Another Concussion It is very important to avoid another brain injury, especially before you have healed. In rare cases, another injury can lead to permanent brain damage, brain swelling, or death. The risk of this is greatest during the first 7-10 days after your injury. Avoid injuries by:   Wearing a seat belt when riding in a car.  Not drinking too much alcohol.  Avoiding activities that could lead to a second concussion (such as contact sports).  Wearing a helmet when doing activities like:  Biking.  Skiing.  Skateboarding.  Skating.  Making your home safer by:  Removing things from the floor or stairways that  could make you trip.  Using grab bars in bathrooms and handrails by stairs.  Placing non-slip mats on floors and in bathtubs.  Improve lighting in dark areas. GET HELP IF:  It gets even harder for you to pay attention or concentrate.  It gets even harder for you to remember things or learn new things.  You need more time than normal to finish things.  You become annoyed (irritable) more than before.  You are not able to deal with stress as well.  You have more problems than before.  You have problems keeping your balance.  You are not able to react quickly when you should. Get help if you have any of these problems for more than 2 weeks:   Lasting (chronic) headaches.  Dizziness or trouble balancing.  Feeling sick to your stomach (nausea).  Seeing (vision) problems.  Being affected by noises or light more than normal.  Feeling sad, low, down in the dumps, blue, gloomy, or empty (depressed).  Mood changes (mood swings).  Feeling of fear or nervousness about what may happen (anxiety).  Feeling annoyed.  Memory problems.  Problems concentrating or paying attention.  Sleep problems.  Feeling tired all the time. GET HELP RIGHT AWAY IF:   You have bad headaches or your headaches get worse.  You have weakness (even if it is in one hand, leg, or part of the face).  You have loss of feeling (numbness).  You feel off balance.  You keep throwing up (vomiting).  You feel tired.  One black center of your eye (pupil) is larger than the other.  You twitch or shake violently (convulse).  Your speech is not clear (slurred).  You are more confused, easily angered (agitated), or annoyed than before.  You have more trouble resting than before.  You are unable to recognize people or places.  You have neck pain.  It is difficult to wake you up.  You have unusual behavior changes.  You pass out (lose consciousness). MAKE SURE YOU:   Understand these  instructions.  Will watch your condition.  Will get help right away if you are not doing well or get worse. Document Released: 01/26/2009 Document Revised: 06/24/2013 Document Reviewed: 08/30/2012 [facility] Patient Information 2015 [place], [place]. This information is not intended to replace advice given to you by your health care provider. Make sure you discuss any questions you have with your health care provider.  Head Injury You have a head injury. Headaches and throwing up (vomiting) are common after a head injury. It should be easy to wake up from sleeping. Sometimes you must stay in the hospital. Most problems happen within the first 24 hours. Side effects may occur up to 7-10 days after the injury.  WHAT ARE THE TYPES OF HEAD INJURIES? Head injuries can be as minor as a bump. Some head injuries can be more severe. More severe head injuries include:  A jarring injury to the brain (concussion).  A bruise of the brain (contusion). This mean there is bleeding in the brain that can cause swelling.  A cracked skull (skull fracture).  Bleeding in the brain that collects, clots, and forms a bump (hematoma). WHEN SHOULD I GET HELP RIGHT AWAY?   You are confused or sleepy.  You cannot be woken up.  You feel sick to your stomach (nauseous) or keep throwing up (vomiting).  Your dizziness or unsteadiness is getting worse.  You have very bad, lasting headaches that are not helped by medicine. Take medicines only as told by your doctor.  You cannot use your arms or legs like normal.  You cannot walk.  You notice changes in the black spots in the center of the colored part of your eye (pupil).  You have clear or bloody fluid coming from your nose or ears.  You have trouble seeing. During the next 24 hours after the injury, you must stay with someone who can watch you. This person should get help right away (call 911 in the [place] if you start to shake and are not able to control it  (have seizures), you pass out, or you are unable to wake up. HOW CAN I PREVENT A HEAD INJURY IN THE FUTURE?  Wear seat belts.  Wear a helmet while bike riding and playing sports like  football.  Stay away from dangerous activities around the house. WHEN CAN I RETURN TO NORMAL ACTIVITIES AND ATHLETICS? See your doctor before doing these activities. You should not do normal activities or play contact sports until 1 week after the following symptoms have stopped:  Headache that does not go away.  Dizziness.  Poor attention.  Confusion.  Memory problems.  Sickness to your stomach or throwing up.  Tiredness.  Fussiness.  Bothered by bright lights or loud noises.  Anxiousness or depression.  Restless sleep. MAKE SURE YOU:   Understand these instructions.  Will watch your condition.  Will get help right away if you are not doing well or get worse. Document Released: 01/21/2008 Document Revised: 06/24/2013 Document Reviewed: 10/15/2012 [facility] Patient Information 2015 [place], [place]. This information is not intended to replace advice given to you by your health care provider. Make sure you discuss any questions you have with your health care provider.  Take the prescription meds as directed. Follow-up with your provider or return as needed for worsening symptoms.

## 2014-09-21 NOTE — ED Notes (Signed)
States she fell from shower this am

## 2014-09-21 NOTE — ED Notes (Signed)
Pt presents to the ER from home with complaints of knot to head and neck pain after falling in bathtub. Pt reports bathtub slippery due to soap and fell. Pt denies passing out in shower. Pt talks in complete sentences no respiratory distress noted,

## 2014-09-21 NOTE — ED Provider Notes (Signed)
[facility] Emergency Department Provider Note ____________________________________________  Time seen: 1540  I have reviewed the triage vital signs and the nursing notes.  HISTORY  Chief Complaint  Fall  HPI Laurie [name] is a 59 y.o. female ED for evaluation management of injury sustained after she took a fall in the bathroom this morning. She describes that about 11 AM she was showeringin the tub, when she inadvertently slipped, she fell out of the tub, towards the toilet. She is unclear of what she fell against. She reports tenderness to the left cheek, but has an abrasion over the left temple. She is also reporting an unknown duration of unconsciousness. She recalls that her husband, who was in the living room watching TV, did not immediately come to her aid. She later walked to the living room and sat with him.  She later presented to the ED for evaluation, driven by her husband. She admits to eating cookies without subsequent nausea & vomiting. She reports her pain at 8/10 in triage. Her primary complaint is her neck.   Past Medical History  Diagnosis Date  . Hyperlipidemia   . Hypertension   . Restless leg syndrome   . Diabetes mellitus     diet controlled  . Panic attack   . Depression   . Treadmill stress test negative for angina pectoris 2009    There are no active problems to display for this patient.   Past Surgical History  Procedure Laterality Date  . Breast lumpectomy  2009    left breast  . Tonsillectomy    . Knee surgery      left knee  . Hand surgery      right hand  . Abdominal hysterectomy  1984    Current Outpatient Rx  Name  Route  Sig  Dispense  Refill  . aspirin 81 MG tablet   Oral   Take 81 mg by mouth daily.         . clonazePAM (KLONOPIN) 1 MG tablet   Oral   Take 1 mg by mouth at bedtime.         Marland Kitchen FLUoxetine (PROZAC) 20 MG capsule   Oral   Take 20 mg by mouth 2 (two) times daily.          Marland Kitchen morphine  (MSIR) 15 MG tablet   Oral   Take 15 mg by mouth 3 (three) times daily.         Marland Kitchen omeprazole (PRILOSEC) 20 MG capsule   Oral   Take 20 mg by mouth daily.         . pravastatin (PRAVACHOL) 40 MG tablet   Oral   Take 40 mg by mouth daily.         . risperiDONE (RISPERIDONE M-TAB) 2 MG disintegrating tablet      Take 0.5 mg table daily with 2 mg tablet at bedtime.         . SERTRALINE HCL PO   Oral   Take 300 mg by mouth daily.         . cyclobenzaprine (FLEXERIL) 5 MG tablet   Oral   Take 1 tablet (5 mg total) by mouth every 8 (eight) hours as needed for muscle spasms.   12 tablet   0   . traMADol (ULTRAM) 50 MG tablet   Oral   Take 1 tablet (50 mg total) by mouth 2 (two) times daily.   10 tablet   0  Allergies Ivp dye; Latex; and Sulfa drugs cross reactors  History reviewed. No pertinent family history.  Social History History  Substance Use Topics  . Smoking status: Never Smoker   . Smokeless tobacco: Not on file  . Alcohol Use: No   Review of Systems  Constitutional: Negative for fever. Eyes: Negative for visual changes. ENT: Negative for sore throat. Cardiovascular: Negative for chest pain. Respiratory: Negative for shortness of breath. Gastrointestinal: Negative for abdominal pain, vomiting and diarrhea. Genitourinary: Negative for dysuria. Musculoskeletal: Negative for back pain. Reports neck pain.  Skin: Negative for rash. Neurological: Negative for headaches, focal weakness or numbness. ____________________________________________  PHYSICAL EXAM:  VITAL SIGNS: ED Triage Vitals  Enc Vitals Group     BP 09/21/14 1324 119/75 mmHg     Pulse Rate 09/21/14 1324 105     Resp 09/21/14 1324 20     Temp 09/21/14 1324 98.7 F (37.1 C)     Temp Source 09/21/14 1324 Oral     SpO2 09/21/14 1324 93 %     Weight 09/21/14 1324 200 lb (90.719 kg)     Height 09/21/14 1324  (1.6 m)     Head Cir --      Peak Flow --      Pain Score 09/21/14  1327 8     Pain Loc --      Pain Edu? --      Excl. in GC? --    Constitutional: Alert and oriented. Well appearing and in no distress. Eyes: Conjunctivae are normal. PERRL. Normal extraocular movements. ENT   Head: Normocephalic and atraumatic, except for superficial abrasion to the left temple.    Nose: No congestion/rhinnorhea.   Mouth/Throat: Mucous membranes are moist.   Neck: Supple. No thyromegaly. Hematological/Lymphatic/Immunilogical: No cervical lymphadenopathy. Cardiovascular: Normal rate, regular rhythm.  Respiratory: Normal respiratory effort. No wheezes/rales/rhonchi. Gastrointestinal: Soft and nontender. No distention. Musculoskeletal: Nontender with normal range of motion in all extremities. Normal spinal alignment without spasm, deformity, or step-off. Minimal tenderness to palp at the posterior left cervical paraspinals. Normal neck ROM.  Neurologic:  CN II-XII grossly intact. Normal gait without ataxia. Normal speech and language. No gross focal neurologic deficits are appreciated. Normal finger-to-nose. Negative Trendelenburg.  Skin:  Skin is warm, dry and intact. No rash noted. Psychiatric: Mood and affect are normal. Patient exhibits appropriate insight and judgment. ____________________________________________   RADIOLOGY CT Head & Neck. IMPRESSION: No intracranial abnormality. Chronic ethmoid sinusitis. No acute bony abnormality in the cervical spine.  I, Juleah Paradise, Charlesetta Ivory, personally viewed and evaluated these images as part of my medical decision making.  ____________________________________________  PROCEDURES  Flexeril 10 mg PO Ultram 100 mg PO ____________________________________________  INITIAL IMPRESSION / ASSESSMENT AND PLAN / ED COURSE  Mechanical fall at home causing facial contusion, neck strain, and mild concussion with self-reported LOC. No radiologic evidence of acute brain injury or cervical fracture. Treatment with  Flexeril and Naprosyn. Follow-up with primary provider as needed. Instructions on self-care and return precautions given.  ____________________________________________  FINAL CLINICAL IMPRESSION(S) / ED DIAGNOSES  Final diagnoses:  Fall at home, initial encounter  Cervical strain, acute, initial encounter  Head injury, closed, with brief LOC     Lissa Hoard, PA-C 09/21/14 1616  Emily Filbert, MD 09/21/14 [phone]

## 2015-02-23 DIAGNOSIS — G894 Chronic pain syndrome: Secondary | ICD-10-CM | POA: Diagnosis not present

## 2015-02-23 DIAGNOSIS — G8929 Other chronic pain: Secondary | ICD-10-CM | POA: Diagnosis not present

## 2015-02-23 DIAGNOSIS — F1120 Opioid dependence, uncomplicated: Secondary | ICD-10-CM | POA: Diagnosis not present

## 2015-03-23 DIAGNOSIS — G894 Chronic pain syndrome: Secondary | ICD-10-CM | POA: Diagnosis not present

## 2015-03-23 DIAGNOSIS — F1120 Opioid dependence, uncomplicated: Secondary | ICD-10-CM | POA: Diagnosis not present

## 2015-03-23 DIAGNOSIS — G8929 Other chronic pain: Secondary | ICD-10-CM | POA: Diagnosis not present

## 2015-04-03 DIAGNOSIS — Z79899 Other long term (current) drug therapy: Secondary | ICD-10-CM | POA: Diagnosis not present

## 2015-04-03 DIAGNOSIS — N390 Urinary tract infection, site not specified: Secondary | ICD-10-CM | POA: Diagnosis not present

## 2015-04-03 DIAGNOSIS — F410 Panic disorder [episodic paroxysmal anxiety] without agoraphobia: Secondary | ICD-10-CM | POA: Diagnosis not present

## 2015-04-03 DIAGNOSIS — F419 Anxiety disorder, unspecified: Secondary | ICD-10-CM | POA: Diagnosis not present

## 2015-04-04 DIAGNOSIS — F419 Anxiety disorder, unspecified: Secondary | ICD-10-CM | POA: Diagnosis not present

## 2015-04-20 DIAGNOSIS — M5440 Lumbago with sciatica, unspecified side: Secondary | ICD-10-CM | POA: Diagnosis not present

## 2015-04-20 DIAGNOSIS — G894 Chronic pain syndrome: Secondary | ICD-10-CM | POA: Diagnosis not present

## 2015-04-20 DIAGNOSIS — G8929 Other chronic pain: Secondary | ICD-10-CM | POA: Diagnosis not present

## 2015-04-20 DIAGNOSIS — M545 Low back pain: Secondary | ICD-10-CM | POA: Diagnosis not present

## 2015-04-20 DIAGNOSIS — I10 Essential (primary) hypertension: Secondary | ICD-10-CM | POA: Diagnosis not present

## 2015-04-20 DIAGNOSIS — G8921 Chronic pain due to trauma: Secondary | ICD-10-CM | POA: Diagnosis not present

## 2015-04-20 DIAGNOSIS — F419 Anxiety disorder, unspecified: Secondary | ICD-10-CM | POA: Diagnosis not present

## 2015-04-20 DIAGNOSIS — E669 Obesity, unspecified: Secondary | ICD-10-CM | POA: Diagnosis not present

## 2015-04-20 DIAGNOSIS — Z79899 Other long term (current) drug therapy: Secondary | ICD-10-CM | POA: Diagnosis not present

## 2015-04-30 DIAGNOSIS — R5383 Other fatigue: Secondary | ICD-10-CM | POA: Diagnosis not present

## 2015-04-30 DIAGNOSIS — R062 Wheezing: Secondary | ICD-10-CM | POA: Diagnosis not present

## 2015-04-30 DIAGNOSIS — R05 Cough: Secondary | ICD-10-CM | POA: Diagnosis not present

## 2015-04-30 DIAGNOSIS — J209 Acute bronchitis, unspecified: Secondary | ICD-10-CM | POA: Diagnosis not present

## 2015-05-27 DIAGNOSIS — E0590 Thyrotoxicosis, unspecified without thyrotoxic crisis or storm: Secondary | ICD-10-CM | POA: Diagnosis not present

## 2015-06-04 DIAGNOSIS — R0602 Shortness of breath: Secondary | ICD-10-CM | POA: Diagnosis not present

## 2015-06-04 DIAGNOSIS — J449 Chronic obstructive pulmonary disease, unspecified: Secondary | ICD-10-CM | POA: Diagnosis not present

## 2015-06-04 DIAGNOSIS — J209 Acute bronchitis, unspecified: Secondary | ICD-10-CM | POA: Diagnosis not present

## 2015-06-04 DIAGNOSIS — F419 Anxiety disorder, unspecified: Secondary | ICD-10-CM | POA: Diagnosis not present

## 2015-06-04 DIAGNOSIS — E559 Vitamin D deficiency, unspecified: Secondary | ICD-10-CM | POA: Diagnosis not present

## 2015-06-15 DIAGNOSIS — F419 Anxiety disorder, unspecified: Secondary | ICD-10-CM | POA: Diagnosis not present

## 2015-06-15 DIAGNOSIS — G8929 Other chronic pain: Secondary | ICD-10-CM | POA: Diagnosis not present

## 2015-06-15 DIAGNOSIS — G894 Chronic pain syndrome: Secondary | ICD-10-CM | POA: Diagnosis not present

## 2015-06-15 DIAGNOSIS — M545 Low back pain: Secondary | ICD-10-CM | POA: Diagnosis not present

## 2015-06-15 DIAGNOSIS — I10 Essential (primary) hypertension: Secondary | ICD-10-CM | POA: Diagnosis not present

## 2015-06-15 DIAGNOSIS — E669 Obesity, unspecified: Secondary | ICD-10-CM | POA: Diagnosis not present

## 2015-06-15 DIAGNOSIS — G8921 Chronic pain due to trauma: Secondary | ICD-10-CM | POA: Diagnosis not present

## 2015-06-15 DIAGNOSIS — M5440 Lumbago with sciatica, unspecified side: Secondary | ICD-10-CM | POA: Diagnosis not present

## 2015-06-26 DIAGNOSIS — E669 Obesity, unspecified: Secondary | ICD-10-CM | POA: Diagnosis not present

## 2015-06-26 DIAGNOSIS — Z79899 Other long term (current) drug therapy: Secondary | ICD-10-CM | POA: Diagnosis not present

## 2015-06-26 DIAGNOSIS — K219 Gastro-esophageal reflux disease without esophagitis: Secondary | ICD-10-CM | POA: Diagnosis not present

## 2015-06-26 DIAGNOSIS — F419 Anxiety disorder, unspecified: Secondary | ICD-10-CM | POA: Diagnosis not present

## 2015-06-26 DIAGNOSIS — T7840XA Allergy, unspecified, initial encounter: Secondary | ICD-10-CM | POA: Diagnosis not present

## 2015-06-27 DIAGNOSIS — Z5181 Encounter for therapeutic drug level monitoring: Secondary | ICD-10-CM | POA: Diagnosis not present

## 2015-08-03 ENCOUNTER — Encounter: Payer: PPO | Attending: Surgery | Admitting: Surgery

## 2015-08-03 DIAGNOSIS — X58XXXA Exposure to other specified factors, initial encounter: Secondary | ICD-10-CM | POA: Insufficient documentation

## 2015-08-03 DIAGNOSIS — F419 Anxiety disorder, unspecified: Secondary | ICD-10-CM | POA: Insufficient documentation

## 2015-08-03 DIAGNOSIS — M545 Low back pain: Secondary | ICD-10-CM | POA: Diagnosis not present

## 2015-08-03 DIAGNOSIS — G894 Chronic pain syndrome: Secondary | ICD-10-CM | POA: Diagnosis not present

## 2015-08-03 DIAGNOSIS — J45909 Unspecified asthma, uncomplicated: Secondary | ICD-10-CM | POA: Insufficient documentation

## 2015-08-03 DIAGNOSIS — F1120 Opioid dependence, uncomplicated: Secondary | ICD-10-CM | POA: Insufficient documentation

## 2015-08-03 DIAGNOSIS — S31829A Unspecified open wound of left buttock, initial encounter: Secondary | ICD-10-CM | POA: Diagnosis not present

## 2015-08-03 DIAGNOSIS — E669 Obesity, unspecified: Secondary | ICD-10-CM | POA: Diagnosis not present

## 2015-08-03 DIAGNOSIS — G8929 Other chronic pain: Secondary | ICD-10-CM | POA: Diagnosis not present

## 2015-08-03 DIAGNOSIS — T2125XA Burn of second degree of buttock, initial encounter: Secondary | ICD-10-CM | POA: Insufficient documentation

## 2015-08-03 DIAGNOSIS — Z79899 Other long term (current) drug therapy: Secondary | ICD-10-CM | POA: Diagnosis not present

## 2015-08-03 DIAGNOSIS — M5440 Lumbago with sciatica, unspecified side: Secondary | ICD-10-CM | POA: Diagnosis not present

## 2015-08-03 DIAGNOSIS — I10 Essential (primary) hypertension: Secondary | ICD-10-CM | POA: Diagnosis not present

## 2015-08-03 DIAGNOSIS — G8921 Chronic pain due to trauma: Secondary | ICD-10-CM | POA: Diagnosis not present

## 2015-08-04 DIAGNOSIS — T2124XA Burn of second degree of lower back, initial encounter: Secondary | ICD-10-CM | POA: Diagnosis not present
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
## 2015-08-04 NOTE — Progress Notes (Signed)
Laurie [name], Arayna K. ([id number]) Visit Report for 08/03/2015 Abuse/Suicide Risk Screen Details Patient Name: Laurie [name], [name] Date of Service: 08/03/2015 3:30 PM Medical Record Number: [id number] Patient Account Number: [id number] Date of Birth/Sex: 01-05-1956 (60 y.o. Female) Treating RN: Phillis HaggisPinkerton, Debi Primary Care Physician: Franco NonesLINDLEY, CHERYL Other Clinician: Referring Physician: Ronita HippsKHAN, FAROOQUE Treating Physician/Extender: Rudene ReBritto, Errol Weeks in Treatment: 0 Abuse/Suicide Risk Screen Items Answer ABUSE/SUICIDE RISK SCREEN: Has anyone close to you tried to hurt or harm you recentlyo No Do you feel uncomfortable with anyone in your familyo No Has anyone forced you do things that you didnot want to doo No Do you have any thoughts of harming yourselfo No Patient displays signs or symptoms of abuse and/or neglect. No Electronic Signature(s) Signed: 08/03/2015 5:25:44 PM By: Alejandro MullingPinkerton, Debra Entered By: Alejandro MullingPinkerton, Debra on 08/03/2015 15:38:33 Laurie [name], [name] ([id number]) -------------------------------------------------------------------------------- Activities of Daily Living Details Patient Name: Laurie [name], [name] Date of Service: 08/03/2015 3:30 PM Medical Record Number: [id number] Patient Account Number: [id number] Date of Birth/Sex: 01-05-1956 (60 y.o. Female) Treating RN: Phillis HaggisPinkerton, Debi Primary Care Physician: Franco NonesLINDLEY, CHERYL Other Clinician: Referring Physician: Ronita HippsKHAN, FAROOQUE Treating Physician/Extender: Rudene ReBritto, Errol Weeks in Treatment: 0 Activities of Daily Living Items Answer Activities of Daily Living (Please select one for each item) Drive Automobile Completely Able Take Medications Completely Able Use Telephone Completely Able Care for Appearance Completely Able Use Toilet Completely Able Bath / Shower Completely Able Dress Self Completely Able Feed Self Completely Able Walk Completely Able Get In / Out Bed Completely Able Housework Completely Able Prepare  Meals Completely Able Handle Money Completely Able Shop for Self Completely Able Electronic Signature(s) Signed: 08/03/2015 5:25:44 PM By: Alejandro MullingPinkerton, Debra Entered By: Alejandro MullingPinkerton, Debra on 08/03/2015 15:38:49 Laurie [name], [name] ([id number]) -------------------------------------------------------------------------------- Education Assessment Details Patient Name: Laurie [name], [name] Date of Service: 08/03/2015 3:30 PM Medical Record Number: [id number] Patient Account Number: [id number] Date of Birth/Sex: 01-05-1956 (60 y.o. Female) Treating RN: Ashok CordiaPinkerton, Debi Primary Care Physician: Franco NonesLINDLEY, CHERYL Other Clinician: Referring Physician: Ronita HippsKHAN, FAROOQUE Treating Physician/Extender: Rudene ReBritto, Errol Weeks in Treatment: 0 Primary Learner Assessed: Patient Learning Preferences/Education Level/Primary Language Learning Preference: Explanation, Printed Material Highest Education Level: High School Preferred Language: English Cognitive Barrier Assessment/Beliefs Language Barrier: No Translator Needed: No Memory Deficit: No Emotional Barrier: No Cultural/Religious Beliefs Affecting Medical No Care: Physical Barrier Assessment Impaired Vision: Yes Glasses Impaired Hearing: No Decreased Hand dexterity: No Knowledge/Comprehension Assessment Knowledge Level: High Comprehension Level: High Ability to understand written High instructions: Ability to understand verbal High instructions: Motivation Assessment Anxiety Level: Calm Cooperation: Cooperative Education Importance: Acknowledges Need Interest in Health Problems: Asks Questions Perception: Coherent Willingness to Engage in Self- High Management Activities: Readiness to Engage in Self- High Management Activities: Electronic Signature(s) Laurie [name], [name] ([id number]) Signed: 08/03/2015 5:25:44 PM By: Alejandro MullingPinkerton, Debra Entered By: Alejandro MullingPinkerton, Debra on 08/03/2015 15:39:17 Laurie [name], [name]  ([id number]) -------------------------------------------------------------------------------- Fall Risk Assessment Details Patient Name: Laurie [name], Natallie K. Date of Service: 08/03/2015 3:30 PM Medical Record Number: [id number] Patient Account Number: [id number] Date of Birth/Sex: 01-05-1956 (60 y.o. Female) Treating RN: Ashok CordiaPinkerton, Debi Primary Care Physician: Franco NonesLINDLEY, CHERYL Other Clinician: Referring Physician: Ronita HippsKHAN, FAROOQUE Treating Physician/Extender: Rudene ReBritto, Errol Weeks in Treatment: 0 Fall Risk Assessment Items Have you had 2 or more falls in the last 12 monthso 0 Yes Have you had any fall that resulted in injury in the last 12 monthso 0 Yes FALL RISK ASSESSMENT: History of falling - immediate or within 3 months 0 No Secondary diagnosis 0 No Ambulatory aid None/bed rest/wheelchair/nurse 0 No Crutches/cane/walker 0 No  Furniture 0 No IV Access/Saline Lock 0 No Gait/Training Normal/bed rest/immobile 0 No Weak 0 No Impaired 0 No Mental Status Oriented to own ability 0 Yes Electronic Signature(s) Signed: 08/03/2015 5:25:44 PM By: Alejandro Mulling Entered By: Alejandro Mulling on 08/03/2015 15:40:22 Laurie Kern ([id number]) -------------------------------------------------------------------------------- Nutrition Risk Assessment Details Patient Name: Laurie Kern. Date of Service: 08/03/2015 3:30 PM Medical Record Number: [id number] Patient Account Number: [id number] Date of Birth/Sex: 01/06/1956 (59 y.o. Female) Treating RN: Ashok Cordia, Debi Primary Care Physician: Franco Nones Other Clinician: Referring Physician: Ronita Hipps Treating Physician/Extender: Rudene Re in Treatment: 0 Height (in): 64 Weight (lbs): 220 Body Mass Index (BMI): 37.8 Nutrition Risk Assessment Items NUTRITION RISK SCREEN: I have an illness or condition that made me change the kind and/or 0 No amount of food I eat I eat fewer than two meals per day 0 No I eat few fruits  and vegetables, or milk products 0 No I have three or more drinks of beer, liquor or wine almost every day 0 No I have tooth or mouth problems that make it hard for me to eat 0 No I don't always have enough money to buy the food I need 0 No I eat alone most of the time 0 No I take three or more different prescribed or over-the-counter drugs a 1 Yes day Without wanting to, I have lost or gained 10 pounds in the last six 0 No months I am not always physically able to shop, cook and/or feed myself 0 No Nutrition Protocols Good Risk Protocol Moderate Risk Protocol Electronic Signature(s) Signed: 08/03/2015 5:25:44 PM By: Alejandro Mulling Entered By: Alejandro Mulling on 08/03/2015 15:40:44

## 2015-08-10 ENCOUNTER — Encounter: Payer: PPO | Admitting: Surgery

## 2015-08-10 DIAGNOSIS — T2124XA Burn of second degree of lower back, initial encounter: Secondary | ICD-10-CM | POA: Diagnosis not present

## 2015-08-10 DIAGNOSIS — T2125XA Burn of second degree of buttock, initial encounter: Secondary | ICD-10-CM | POA: Diagnosis not present

## 2015-08-10 DIAGNOSIS — S31829A Unspecified open wound of left buttock, initial encounter: Secondary | ICD-10-CM | POA: Diagnosis not present

## 2015-08-10 DIAGNOSIS — G894 Chronic pain syndrome: Secondary | ICD-10-CM | POA: Diagnosis not present

## 2015-08-10 DIAGNOSIS — F1120 Opioid dependence, uncomplicated: Secondary | ICD-10-CM | POA: Diagnosis not present
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
## 2015-08-13 DIAGNOSIS — Z1231 Encounter for screening mammogram for malignant neoplasm of breast: Secondary | ICD-10-CM | POA: Diagnosis not present

## 2015-08-17 ENCOUNTER — Encounter: Payer: PPO | Admitting: Surgery

## 2015-08-17 DIAGNOSIS — G894 Chronic pain syndrome: Secondary | ICD-10-CM | POA: Diagnosis not present

## 2015-08-17 DIAGNOSIS — F1120 Opioid dependence, uncomplicated: Secondary | ICD-10-CM | POA: Diagnosis not present

## 2015-08-17 DIAGNOSIS — T2124XA Burn of second degree of lower back, initial encounter: Secondary | ICD-10-CM | POA: Diagnosis not present

## 2015-08-17 DIAGNOSIS — T2125XA Burn of second degree of buttock, initial encounter: Secondary | ICD-10-CM | POA: Diagnosis not present

## 2015-08-17 DIAGNOSIS — S31829A Unspecified open wound of left buttock, initial encounter: Secondary | ICD-10-CM | POA: Diagnosis not present
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
## 2015-08-24 ENCOUNTER — Ambulatory Visit: Payer: PPO | Admitting: Surgery
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
## 2015-08-31 ENCOUNTER — Encounter: Payer: PPO | Attending: General Surgery | Admitting: General Surgery

## 2015-08-31 DIAGNOSIS — S31829A Unspecified open wound of left buttock, initial encounter: Secondary | ICD-10-CM | POA: Diagnosis not present

## 2015-08-31 DIAGNOSIS — F419 Anxiety disorder, unspecified: Secondary | ICD-10-CM | POA: Insufficient documentation

## 2015-08-31 DIAGNOSIS — X58XXXA Exposure to other specified factors, initial encounter: Secondary | ICD-10-CM | POA: Diagnosis not present

## 2015-08-31 DIAGNOSIS — T2125XA Burn of second degree of buttock, initial encounter: Secondary | ICD-10-CM | POA: Insufficient documentation

## 2015-08-31 DIAGNOSIS — I10 Essential (primary) hypertension: Secondary | ICD-10-CM | POA: Diagnosis not present

## 2015-08-31 DIAGNOSIS — F1120 Opioid dependence, uncomplicated: Secondary | ICD-10-CM | POA: Insufficient documentation

## 2015-08-31 DIAGNOSIS — J45909 Unspecified asthma, uncomplicated: Secondary | ICD-10-CM | POA: Diagnosis not present

## 2015-08-31 DIAGNOSIS — T2125XD Burn of second degree of buttock, subsequent encounter: Secondary | ICD-10-CM | POA: Diagnosis not present

## 2015-08-31 DIAGNOSIS — G894 Chronic pain syndrome: Secondary | ICD-10-CM | POA: Diagnosis not present
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
## 2015-09-01 DIAGNOSIS — T2124XA Burn of second degree of lower back, initial encounter: Secondary | ICD-10-CM | POA: Diagnosis not present
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
## 2015-09-07 ENCOUNTER — Encounter: Payer: PPO | Admitting: Surgery

## 2015-09-07 DIAGNOSIS — T2125XA Burn of second degree of buttock, initial encounter: Secondary | ICD-10-CM | POA: Diagnosis not present

## 2015-09-07 DIAGNOSIS — S31829A Unspecified open wound of left buttock, initial encounter: Secondary | ICD-10-CM | POA: Diagnosis not present

## 2015-09-07 DIAGNOSIS — G894 Chronic pain syndrome: Secondary | ICD-10-CM | POA: Diagnosis not present

## 2015-09-07 DIAGNOSIS — T2124XA Burn of second degree of lower back, initial encounter: Secondary | ICD-10-CM | POA: Diagnosis not present
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
## 2015-09-09 DIAGNOSIS — H52223 Regular astigmatism, bilateral: Secondary | ICD-10-CM | POA: Diagnosis not present

## 2015-09-09 DIAGNOSIS — Z0101 Encounter for examination of eyes and vision with abnormal findings: Secondary | ICD-10-CM | POA: Diagnosis not present

## 2015-09-09 DIAGNOSIS — H524 Presbyopia: Secondary | ICD-10-CM | POA: Diagnosis not present

## 2015-09-09 DIAGNOSIS — H2513 Age-related nuclear cataract, bilateral: Secondary | ICD-10-CM | POA: Diagnosis not present

## 2015-09-09 DIAGNOSIS — H5203 Hypermetropia, bilateral: Secondary | ICD-10-CM | POA: Diagnosis not present

## 2015-09-14 ENCOUNTER — Encounter: Payer: PPO | Admitting: Surgery

## 2015-09-14 DIAGNOSIS — S31829A Unspecified open wound of left buttock, initial encounter: Secondary | ICD-10-CM | POA: Diagnosis not present

## 2015-09-14 DIAGNOSIS — G894 Chronic pain syndrome: Secondary | ICD-10-CM | POA: Diagnosis not present

## 2015-09-14 DIAGNOSIS — T2124XA Burn of second degree of lower back, initial encounter: Secondary | ICD-10-CM | POA: Diagnosis not present

## 2015-09-14 DIAGNOSIS — T2125XA Burn of second degree of buttock, initial encounter: Secondary | ICD-10-CM | POA: Diagnosis not present
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
## 2015-09-21 ENCOUNTER — Encounter: Payer: PPO | Admitting: Surgery

## 2015-09-21 DIAGNOSIS — G894 Chronic pain syndrome: Secondary | ICD-10-CM | POA: Diagnosis not present

## 2015-09-21 DIAGNOSIS — T2124XA Burn of second degree of lower back, initial encounter: Secondary | ICD-10-CM | POA: Diagnosis not present

## 2015-09-21 DIAGNOSIS — T2125XA Burn of second degree of buttock, initial encounter: Secondary | ICD-10-CM | POA: Diagnosis not present

## 2015-09-21 DIAGNOSIS — S31829A Unspecified open wound of left buttock, initial encounter: Secondary | ICD-10-CM | POA: Diagnosis not present
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
## 2015-09-25 DIAGNOSIS — F419 Anxiety disorder, unspecified: Secondary | ICD-10-CM | POA: Diagnosis not present

## 2015-09-25 DIAGNOSIS — E559 Vitamin D deficiency, unspecified: Secondary | ICD-10-CM | POA: Diagnosis not present

## 2015-09-25 DIAGNOSIS — E784 Other hyperlipidemia: Secondary | ICD-10-CM | POA: Diagnosis not present

## 2015-09-25 DIAGNOSIS — E669 Obesity, unspecified: Secondary | ICD-10-CM | POA: Diagnosis not present

## 2015-09-25 DIAGNOSIS — Z79899 Other long term (current) drug therapy: Secondary | ICD-10-CM | POA: Diagnosis not present

## 2015-09-26 DIAGNOSIS — Z79891 Long term (current) use of opiate analgesic: Secondary | ICD-10-CM | POA: Diagnosis not present

## 2015-09-28 ENCOUNTER — Encounter: Payer: PPO | Attending: Surgery | Admitting: Surgery

## 2015-09-28 DIAGNOSIS — X58XXXA Exposure to other specified factors, initial encounter: Secondary | ICD-10-CM | POA: Insufficient documentation

## 2015-09-28 DIAGNOSIS — M545 Low back pain: Secondary | ICD-10-CM | POA: Diagnosis not present

## 2015-09-28 DIAGNOSIS — G8921 Chronic pain due to trauma: Secondary | ICD-10-CM | POA: Diagnosis not present

## 2015-09-28 DIAGNOSIS — F419 Anxiety disorder, unspecified: Secondary | ICD-10-CM | POA: Diagnosis not present

## 2015-09-28 DIAGNOSIS — I10 Essential (primary) hypertension: Secondary | ICD-10-CM | POA: Insufficient documentation

## 2015-09-28 DIAGNOSIS — M5440 Lumbago with sciatica, unspecified side: Secondary | ICD-10-CM | POA: Diagnosis not present

## 2015-09-28 DIAGNOSIS — E669 Obesity, unspecified: Secondary | ICD-10-CM | POA: Diagnosis not present

## 2015-09-28 DIAGNOSIS — T2125XA Burn of second degree of buttock, initial encounter: Secondary | ICD-10-CM | POA: Insufficient documentation

## 2015-09-28 DIAGNOSIS — F1120 Opioid dependence, uncomplicated: Secondary | ICD-10-CM | POA: Insufficient documentation

## 2015-09-28 DIAGNOSIS — Z79899 Other long term (current) drug therapy: Secondary | ICD-10-CM | POA: Diagnosis not present

## 2015-09-28 DIAGNOSIS — S31829A Unspecified open wound of left buttock, initial encounter: Secondary | ICD-10-CM | POA: Insufficient documentation

## 2015-09-28 DIAGNOSIS — G894 Chronic pain syndrome: Secondary | ICD-10-CM | POA: Insufficient documentation

## 2015-09-28 DIAGNOSIS — G8929 Other chronic pain: Secondary | ICD-10-CM | POA: Diagnosis not present
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
## 2015-09-29 NOTE — Progress Notes (Signed)
Laurie [name], Laurie [name] ([id number]) Visit Report for 09/28/2015 Chief Complaint Document Details Patient Name: Laurie [name], Laurie [name]. Date of Service: 09/28/2015 12:45 PM Medical Record Number: [id number] Patient Account Number: [id number] Date of Birth/Sex: [birth year] (58 y.o. Female) Treating RN: Huel Coventry Primary Care Physician: Franco Nones Other Clinician: Referring Physician: Franco Nones Treating Physician/Extender: Rudene Re in Treatment: 8 Information Obtained from: Patient Chief Complaint Patient presents to the wound care center with burn wound(s) to the left lower lumbar area and upper buttock on the left side for about 3 days Electronic Signature(s) Signed: 09/28/2015 12:17:04 PM By: Evlyn Kanner MD, FACS Entered By: Evlyn Kanner on 09/28/2015 12:17:04 Laurie [name] ([id number]) -------------------------------------------------------------------------------- HPI Details Patient Name: Laurie [name]. Date of Service: 09/28/2015 12:45 PM Medical Record Number: [id number] Patient Account Number: [id number] Date of Birth/Sex: 1955/07/21 (60 y.o. Female) Treating RN: Huel Coventry Primary Care Physician: Franco Nones Other Clinician: Referring Physician: Franco Nones Treating Physician/Extender: Rudene Re in Treatment: 8 History of Present Illness Location: left lower back and buttock Quality: has a dull pain in this area which is mild Severity: the pain is intermittent Duration: the pain has been there with the wound for about 3 days Context: caused by a heating pad Modifying Factors: has been applying some local ointment and salve Associated Signs and Symptoms: there is significant discomfort in this area HPI Description: 60 year old patient with a past medical history of chronic pain syndrome, lumbago, anxiety disorder, opioid dependence and essential hypertension was sent was by her pain clinic physician Dr. Park Breed. She went to bed 3 days ago  with a heating pad on her left lower back for symptomatic relief and came off with significant blisters and burns over this. He is not a smoker. Asked medical history significant for anxiety and depression. Electronic Signature(s) Signed: 09/28/2015 12:17:09 PM By: Evlyn Kanner MD, FACS Entered By: Evlyn Kanner on 09/28/2015 12:17:09 Laurie [name] ([id number]) -------------------------------------------------------------------------------- Physical Exam Details Patient Name: Laurie [name]. Date of Service: 09/28/2015 12:45 PM Medical Record Number: [id number] Patient Account Number: [id number] Date of Birth/Sex: March 05, 1955 (60 y.o. Female) Treating RN: Huel Coventry Primary Care Physician: Franco Nones Other Clinician: Referring Physician: Franco Nones Treating Physician/Extender: Rudene Re in Treatment: 8 Constitutional . Pulse regular. Respirations normal and unlabored. Afebrile. . Eyes Nonicteric. Reactive to light. Ears, Nose, Mouth, and Throat Lips, teeth, and gums WNL.Marland Kitchen Moist mucosa without lesions. Neck supple and nontender. No palpable supraclavicular or cervical adenopathy. Normal sized without goiter. Respiratory WNL. No retractions.. Cardiovascular Pedal Pulses WNL. No clubbing, cyanosis or edema. Lymphatic No adneopathy. No adenopathy. No adenopathy. Musculoskeletal Adexa without tenderness or enlargement.. Digits and nails w/o clubbing, cyanosis, infection, petechiae, ischemia, or inflammatory conditions.. Integumentary (Hair, Skin) No suspicious lesions. No crepitus or fluctuance. No peri-wound warmth or erythema. No masses.Marland Kitchen Psychiatric Judgement and insight Intact.. No evidence of depression, anxiety, or agitation.. Notes there is some hyper granulation tissue which bleeds briskly to touch and I have recommended changing over to RTD foam. no debridement was required today Electronic Signature(s) Signed: 09/28/2015 12:18:08 PM By: Evlyn Kanner MD, FACS Entered By: Evlyn Kanner on 09/28/2015 12:18:08 Laurie [name] ([id number]) -------------------------------------------------------------------------------- Physician Orders Details Patient Name: Laurie [name]. Date of Service: 09/28/2015 12:45 PM Medical Record Number: [id number] Patient Account Number: [id number] Date of Birth/Sex: 1955/05/05 (60 y.o. Female) Treating RN: Huel Coventry Primary Care Physician: Franco Nones Other Clinician: Referring Physician: Franco Nones Treating Physician/Extender: Rudene Re in Treatment: 8 Verbal / Phone Orders: Yes  Clinician: Huel CoventryWoody, Kim Read Back and Verified: Yes Diagnosis Coding Wound Cleansing Wound #1 Left,Distal,Lateral Back o Cleanse wound with mild soap and water Anesthetic Wound #1 Left,Distal,Lateral Back o Topical Lidocaine 4% cream applied to wound bed prior to debridement - for clinic use only Skin Barriers/Peri-Wound Care Wound #1 Left,Distal,Lateral Back o Skin Prep Primary Wound Dressing Wound #1 Left,Distal,Lateral Back o RTD Secondary Dressing Wound #1 Left,Distal,Lateral Back o Boardered Foam Dressing Dressing Change Frequency Wound #1 Left,Distal,Lateral Back o Three times weekly Follow-up Appointments Wound #1 Left,Distal,Lateral Back o Return Appointment in 1 week. Additional Orders / Instructions Wound #1 Left,Distal,Lateral Back o Increase protein intake. Electronic Signature(s) Laurie [name], [name] ([id number]) Signed: 09/28/2015 3:30:17 PM By: Evlyn KannerBritto, Norah Devin MD, FACS Signed: 09/28/2015 5:30:41 PM By: Elliot GurneyWoody, RN, BSN, Kim RN, BSN Entered By: Elliot GurneyWoody, RN, BSN, Kim on 09/28/2015 12:10:46 Laurie [name], [name] ([id number]) -------------------------------------------------------------------------------- Problem List Details Patient Name: Laurie [name], Laurie [name] K. Date of Service: 09/28/2015 12:45 PM Medical Record Number: [id number] Patient Account Number: [id number] Date of  Birth/Sex: 05-28-1955 7(60 y.o. Female) Treating RN: Huel CoventryWoody, Kim Primary Care Physician: Franco NonesLINDLEY, CHERYL Other Clinician: Referring Physician: Franco NonesLINDLEY, CHERYL Treating Physician/Extender: Rudene ReBritto, Roizy Harold Weeks in Treatment: 8 Active Problems ICD-10 Encounter Code Description Active Date Diagnosis T21.25XA Burn of second degree of buttock, initial encounter 08/03/2015 Yes S31.829A Unspecified open wound of left buttock, initial encounter 08/03/2015 Yes G89.4 Chronic pain syndrome 08/03/2015 Yes F11.20 Opioid dependence, uncomplicated 08/03/2015 Yes Inactive Problems Resolved Problems Electronic Signature(s) Signed: 09/28/2015 12:16:23 PM By: Evlyn KannerBritto, Josselyne Onofrio MD, FACS Entered By: Evlyn KannerBritto, Lizanne Erker on 09/28/2015 12:16:23 Laurie [name], [name] ([id number]) -------------------------------------------------------------------------------- Progress Note Details Patient Name: Laurie [name], [name] Date of Service: 09/28/2015 12:45 PM Medical Record Number: [id number] Patient Account Number: [id number] Date of Birth/Sex: 05-28-1955 [age] y.o. Female) Treating RN: Huel CoventryWoody, Kim Primary Care Physician: Franco NonesLINDLEY, CHERYL Other Clinician: Referring Physician: Franco NonesLINDLEY, CHERYL Treating Physician/Extender: Rudene ReBritto, Ebonie Westerlund Weeks in Treatment: 8 Subjective Chief Complaint Information obtained from Patient Patient presents to the wound care center with burn wound(s) to the left lower lumbar area and upper buttock on the left side for about 3 days History of Present Illness (HPI) The following HPI elements were documented for the patient's wound: Location: left lower back and buttock Quality: has a dull pain in this area which is mild Severity: the pain is intermittent Duration: the pain has been there with the wound for about 3 days Context: caused by a heating pad Modifying Factors: has been applying some local ointment and salve Associated Signs and Symptoms: there is significant discomfort in this area 60 year old patient  with a past medical history of chronic pain syndrome, lumbago, anxiety disorder, opioid dependence and essential hypertension was sent was by her pain clinic physician Dr. Park BreedKahn. She went to bed 3 days ago with a heating pad on her left lower back for symptomatic relief and came off with significant blisters and burns over this. He is not a smoker. Asked medical history significant for anxiety and depression. Objective Constitutional Pulse regular. Respirations normal and unlabored. Afebrile. Vitals Time Taken: 11:57 AM, Height: 64 in, Weight: 220 lbs, BMI: 37.8, Temperature: 98.3 F, Pulse: 113 bpm, Respiratory Rate: 20 breaths/min, Blood Pressure: 142/76 mmHg. Eyes Nonicteric. Reactive to light. Laurie [name], [name] ([id number]) Ears, Nose, Mouth, and Throat Lips, teeth, and gums WNL.Marland Kitchen. Moist mucosa without lesions. Neck supple and nontender. No palpable supraclavicular or cervical adenopathy. Normal sized without goiter. Respiratory WNL. No retractions.. Cardiovascular Pedal Pulses WNL. No clubbing, cyanosis or edema. Lymphatic No adneopathy. No adenopathy. No  adenopathy. Musculoskeletal Adexa without tenderness or enlargement.. Digits and nails w/o clubbing, cyanosis, infection, petechiae, ischemia, or inflammatory conditions.Marland Kitchen Psychiatric Judgement and insight Intact.. No evidence of depression, anxiety, or agitation.. General Notes: there is some hyper granulation tissue which bleeds briskly to touch and I have recommended changing over to RTD foam. no debridement was required today Integumentary (Hair, Skin) No suspicious lesions. No crepitus or fluctuance. No peri-wound warmth or erythema. No masses.. Wound #1 status is Open. Original cause of wound was Thermal Burn. The wound is located on the Left,Distal,Lateral Back. The wound measures 3.5cm length x 2cm width x 0.1cm depth; 5.498cm^2 area and 0.55cm^3 volume. The wound is limited to skin breakdown. There is no tunneling or  undermining noted. There is a medium amount of purulent drainage noted. The wound margin is distinct with the outline attached to the wound base. There is medium (34-66%) pink granulation within the wound bed. There is a medium (34-66%) amount of necrotic tissue within the wound bed including Adherent Slough. The periwound skin appearance exhibited: Scarring, Moist, Hemosiderin Staining, Erythema. The periwound skin appearance did not exhibit: Ecchymosis. The surrounding wound skin color is noted with erythema which is circumferential. Periwound temperature was noted as No Abnormality. The periwound has tenderness on palpation. Assessment Active Problems ICD-10 T21.25XA - Burn of second degree of buttock, initial encounter Laurie [name], Laurie [name] ([id number]) S31.829A - Unspecified open wound of left buttock, initial encounter G89.4 - Chronic pain syndrome F11.20 - Opioid dependence, uncomplicated Plan Wound Cleansing: Wound #1 Left,Distal,Lateral Back: Cleanse wound with mild soap and water Anesthetic: Wound #1 Left,Distal,Lateral Back: Topical Lidocaine 4% cream applied to wound bed prior to debridement - for clinic use only Skin Barriers/Peri-Wound Care: Wound #1 Left,Distal,Lateral Back: Skin Prep Primary Wound Dressing: Wound #1 Left,Distal,Lateral Back: RTD Secondary Dressing: Wound #1 Left,Distal,Lateral Back: Boardered Foam Dressing Dressing Change Frequency: Wound #1 Left,Distal,Lateral Back: Three times weekly Follow-up Appointments: Wound #1 Left,Distal,Lateral Back: Return Appointment in 1 week. Additional Orders / Instructions: Wound #1 Left,Distal,Lateral Back: Increase protein intake. There is some hyper granulation tissue which bleeds briskly to touch and I have recommended changing over to RTD foam. the dressing can be changed about twice this week Electronic Signature(s) Signed: 09/28/2015 12:18:40 PM By: Evlyn Kanner MD, FACS Entered By: Evlyn Kanner on  09/28/2015 12:18:40 Laurie [name] ([id number]) Maragh, Neta Mends ([id number]) -------------------------------------------------------------------------------- SuperBill Details Patient Name: Laurie [name]. Date of Service: 09/28/2015 Medical Record Number: [id number] Patient Account Number: [id number] Date of Birth/Sex: Aug 08, 1955 (60 y.o. Female) Treating RN: Huel Coventry Primary Care Physician: Franco Nones Other Clinician: Referring Physician: Franco Nones Treating Physician/Extender: Rudene Re in Treatment: 8 Diagnosis Coding ICD-10 Codes Code Description T21.25XA Burn of second degree of buttock, initial encounter S31.829A Unspecified open wound of left buttock, initial encounter G89.4 Chronic pain syndrome F11.20 Opioid dependence, uncomplicated Facility Procedures CPT4 Code: [id number] Description: 99213 - WOUND CARE VISIT-LEV 3 EST PT Modifier: Quantity: 1 Physician Procedures CPT4 Code: [id number] Description: 99213 - WC PHYS LEVEL 3 - EST PT ICD-10 Description Diagnosis T21.25XA Burn of second degree of buttock, initial encoun S31.829A Unspecified open wound of left buttock, initial G89.4 Chronic pain syndrome F11.20 Opioid dependence,  uncomplicated Modifier: ter encounter Quantity: 1 Electronic Signature(s) Signed: 09/28/2015 12:18:59 PM By: Evlyn Kanner MD, FACS Entered By: Evlyn Kanner on 09/28/2015 12:18:59

## 2015-10-05 ENCOUNTER — Encounter: Payer: PPO | Admitting: Surgery

## 2015-10-05 DIAGNOSIS — X16XXXA Contact with hot heating appliances, radiators and pipes, initial encounter: Secondary | ICD-10-CM | POA: Diagnosis not present

## 2015-10-05 DIAGNOSIS — S31829A Unspecified open wound of left buttock, initial encounter: Secondary | ICD-10-CM | POA: Diagnosis not present

## 2015-10-05 DIAGNOSIS — T2125XA Burn of second degree of buttock, initial encounter: Secondary | ICD-10-CM | POA: Diagnosis not present
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
## 2015-10-12 ENCOUNTER — Encounter: Payer: PPO | Admitting: Surgery

## 2015-10-12 DIAGNOSIS — T2125XA Burn of second degree of buttock, initial encounter: Secondary | ICD-10-CM | POA: Diagnosis not present

## 2015-10-12 DIAGNOSIS — T2124XA Burn of second degree of lower back, initial encounter: Secondary | ICD-10-CM | POA: Diagnosis not present

## 2015-10-12 DIAGNOSIS — F1120 Opioid dependence, uncomplicated: Secondary | ICD-10-CM | POA: Diagnosis not present

## 2015-10-12 DIAGNOSIS — S31829A Unspecified open wound of left buttock, initial encounter: Secondary | ICD-10-CM | POA: Diagnosis not present
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
## 2015-10-19 ENCOUNTER — Encounter: Payer: PPO | Admitting: Surgery

## 2015-10-19 DIAGNOSIS — T2124XA Burn of second degree of lower back, initial encounter: Secondary | ICD-10-CM | POA: Diagnosis not present

## 2015-10-19 DIAGNOSIS — S31829A Unspecified open wound of left buttock, initial encounter: Secondary | ICD-10-CM | POA: Diagnosis not present
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
## 2015-11-02 ENCOUNTER — Ambulatory Visit: Payer: PPO | Admitting: Surgery

## 2015-11-05 ENCOUNTER — Encounter: Payer: PPO | Attending: Surgery | Admitting: Surgery

## 2015-11-05 DIAGNOSIS — X58XXXA Exposure to other specified factors, initial encounter: Secondary | ICD-10-CM | POA: Diagnosis not present

## 2015-11-05 DIAGNOSIS — F1120 Opioid dependence, uncomplicated: Secondary | ICD-10-CM | POA: Diagnosis not present

## 2015-11-05 DIAGNOSIS — I10 Essential (primary) hypertension: Secondary | ICD-10-CM | POA: Insufficient documentation

## 2015-11-05 DIAGNOSIS — T2125XA Burn of second degree of buttock, initial encounter: Secondary | ICD-10-CM | POA: Insufficient documentation

## 2015-11-05 DIAGNOSIS — T2124XA Burn of second degree of lower back, initial encounter: Secondary | ICD-10-CM | POA: Diagnosis not present

## 2015-11-05 DIAGNOSIS — F419 Anxiety disorder, unspecified: Secondary | ICD-10-CM | POA: Insufficient documentation

## 2015-11-05 DIAGNOSIS — G894 Chronic pain syndrome: Secondary | ICD-10-CM | POA: Insufficient documentation

## 2015-11-05 DIAGNOSIS — S31829A Unspecified open wound of left buttock, initial encounter: Secondary | ICD-10-CM | POA: Insufficient documentation
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
## 2015-11-12 ENCOUNTER — Encounter: Payer: PPO | Admitting: Surgery

## 2015-11-12 DIAGNOSIS — S31829A Unspecified open wound of left buttock, initial encounter: Secondary | ICD-10-CM | POA: Diagnosis not present

## 2015-11-12 DIAGNOSIS — G894 Chronic pain syndrome: Secondary | ICD-10-CM | POA: Diagnosis not present

## 2015-11-12 DIAGNOSIS — T2124XA Burn of second degree of lower back, initial encounter: Secondary | ICD-10-CM | POA: Diagnosis not present
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
## 2015-11-19 ENCOUNTER — Encounter: Payer: PPO | Admitting: Surgery

## 2015-11-19 DIAGNOSIS — G894 Chronic pain syndrome: Secondary | ICD-10-CM | POA: Diagnosis not present

## 2015-11-19 DIAGNOSIS — F1120 Opioid dependence, uncomplicated: Secondary | ICD-10-CM | POA: Diagnosis not present

## 2015-11-19 DIAGNOSIS — T2124XD Burn of second degree of lower back, subsequent encounter: Secondary | ICD-10-CM | POA: Diagnosis not present

## 2015-11-19 DIAGNOSIS — S31829A Unspecified open wound of left buttock, initial encounter: Secondary | ICD-10-CM | POA: Diagnosis not present
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
## 2015-11-20 NOTE — Progress Notes (Signed)
JAELIE, AGUILERA ([id number]) Visit Report for 11/19/2015 Arrival Information Details Patient Name: Laurie [name], Laurie [name]. Date of Service: 11/19/2015 12:45 PM Medical Record Number: [id number] Patient Account Number: [id number] Date of Birth/Sex: 04-Sep-1955 (60 y.o. Female) Treating RN: Huel Coventry Primary Care Physician: Franco Nones Other Clinician: Referring Physician: Franco Nones Treating Physician/Extender: Rudene Re in Treatment: 15 Visit Information History Since Last Visit Added or deleted any medications: No Patient Arrived: Ambulatory Any new allergies or adverse reactions: No Arrival Time: 12:39 Had a fall or experienced change in No Accompanied By: self activities of daily living that may affect Transfer Assistance: None risk of falls: Patient Identification Verified: Yes Signs or symptoms of abuse/neglect since last No Secondary Verification Process Yes visito Completed: Hospitalized since last visit: No Patient Requires Transmission-Based No Has Dressing in Place as Prescribed: No Precautions: Pain Present Now: No Patient Has Alerts: No Electronic Signature(s) Signed: 11/19/2015 1:56:10 PM By: Elliot Gurney, RN, BSN, Kim RN, BSN Entered By: Elliot Gurney, RN, BSN, Kim on 11/19/2015 12:39:34 Laurie [name] ([id number]) -------------------------------------------------------------------------------- Clinic Level of Care Assessment Details Patient Name: Laurie [name]. Date of Service: 11/19/2015 12:45 PM Medical Record Number: [id number] Patient Account Number: [id number] Date of Birth/Sex: August 08, 1955 (60 y.o. Female) Treating RN: Afful, RN, BSN, [place] Primary Care Physician: Franco Nones Other Clinician: Referring Physician: Franco Nones Treating Physician/Extender: Rudene Re in Treatment: 15 Clinic Level of Care Assessment Items TOOL 4 Quantity Score []  - Use when only an EandM is performed on FOLLOW-UP visit 0 ASSESSMENTS - Nursing  Assessment / Reassessment X - Reassessment of Co-morbidities (includes updates in patient status) 1 10 X - Reassessment of Adherence to Treatment Plan 1 5 ASSESSMENTS - Wound and Skin Assessment / Reassessment X - Simple Wound Assessment / Reassessment - one wound 1 5 []  - Complex Wound Assessment / Reassessment - multiple wounds 0 []  - Dermatologic / Skin Assessment (not related to wound area) 0 ASSESSMENTS - Focused Assessment []  - Circumferential Edema Measurements - multi extremities 0 []  - Nutritional Assessment / Counseling / Intervention 0 []  - Lower Extremity Assessment (monofilament, tuning fork, pulses) 0 []  - Peripheral Arterial Disease Assessment (using hand held doppler) 0 ASSESSMENTS - Ostomy and/or Continence Assessment and Care []  - Incontinence Assessment and Management 0 []  - Ostomy Care Assessment and Management (repouching, etc.) 0 PROCESS - Coordination of Care X - Simple Patient / Family Education for ongoing care 1 15 []  - Complex (extensive) Patient / Family Education for ongoing care 0 []  - Staff obtains Chiropractor, Records, Test Results / Process Orders 0 []  - Staff telephones HHA, Nursing Homes / Clarify orders / etc 0 []  - Routine Transfer to another Facility (non-emergent condition) 0 Bonk, Dekayla K. ([id number]) []  - Routine Hospital Admission (non-emergent condition) 0 []  - New Admissions / Manufacturing engineer / Ordering NPWT, Apligraf, etc. 0 []  - Emergency Hospital Admission (emergent condition) 0 []  - Simple Discharge Coordination 0 []  - Complex (extensive) Discharge Coordination 0 PROCESS - Special Needs []  - Pediatric / Minor Patient Management 0 []  - Isolation Patient Management 0 []  - Hearing / Language / Visual special needs 0 []  - Assessment of Community assistance (transportation, D/C planning, etc.) 0 []  - Additional assistance / Altered mentation 0 []  - Support Surface(s) Assessment (bed, cushion, seat, etc.) 0 INTERVENTIONS - Wound  Cleansing / Measurement []  - Simple Wound Cleansing - one wound 0 []  - Complex Wound Cleansing - multiple wounds 0 X - Wound Imaging (photographs - any number of  wounds) 1 5 []  - Wound Tracing (instead of photographs) 0 []  - Simple Wound Measurement - one wound 0 []  - Complex Wound Measurement - multiple wounds 0 INTERVENTIONS - Wound Dressings []  - Small Wound Dressing one or multiple wounds 0 []  - Medium Wound Dressing one or multiple wounds 0 []  - Large Wound Dressing one or multiple wounds 0 []  - Application of Medications - topical 0 []  - Application of Medications - injection 0 INTERVENTIONS - Miscellaneous []  - External ear exam 0 Komorowski, Jennamarie K. ([id number]) []  - Specimen Collection (cultures, biopsies, blood, body fluids, etc.) 0 []  - Specimen(s) / Culture(s) sent or taken to Lab for analysis 0 []  - Patient Transfer (multiple staff / Michiel Sites Lift / Similar devices) 0 []  - Simple Staple / Suture removal (25 or less) 0 []  - Complex Staple / Suture removal (26 or more) 0 []  - Hypo / Hyperglycemic Management (close monitor of Blood Glucose) 0 []  - Ankle / Brachial Index (ABI) - do not check if billed separately 0 X - Vital Signs 1 5 Has the patient been seen at the hospital within the last three years: Yes Total Score: 45 Level Of Care: New/Established - Level 2 Electronic Signature(s) Signed: 11/19/2015 5:32:18 PM By: Elpidio Eric BSN, RN Entered By: Elpidio Eric on 11/19/2015 12:47:26 Laurie [name] ([id number]) -------------------------------------------------------------------------------- Encounter Discharge Information Details Patient Name: Laurie [name]. Date of Service: 11/19/2015 12:45 PM Medical Record Number: [id number] Patient Account Number: [id number] Date of Birth/Sex: August 31, 1955 (60 y.o. Female) Treating RN: Huel Coventry Primary Care Physician: Franco Nones Other Clinician: Referring Physician: Franco Nones Treating Physician/Extender: Rudene Re in Treatment: 15 Encounter Discharge Information Items Discharge Pain Level: 0 Discharge Condition: Stable Ambulatory Status: Ambulatory Discharge Destination: Home Transportation: Private Auto Accompanied By: self Schedule Follow-up Appointment: Yes Medication Reconciliation completed and provided to Patient/Care No Natasha Burda: Provided on Clinical Summary of Care: 11/19/2015 Form Type Recipient Paper Patient WR Electronic Signature(s) Signed: 11/19/2015 12:49:20 PM By: Gwenlyn Perking Entered By: Gwenlyn Perking on 11/19/2015 12:49:20 Laurie [name] ([id number]) -------------------------------------------------------------------------------- Multi Wound Chart Details Patient Name: Laurie [name]. Date of Service: 11/19/2015 12:45 PM Medical Record Number: [id number] Patient Account Number: [id number] Date of Birth/Sex: [birth year] (60 y.o. Female) Treating RN: Huel Coventry Primary Care Physician: Franco Nones Other Clinician: Referring Physician: Franco Nones Treating Physician/Extender: Rudene Re in Treatment: 15 Vital Signs Height(in): 64 Pulse(bpm): 108 Weight(lbs): 220 Blood Pressure 128/76 (mmHg): Body Mass Index(BMI): 38 Temperature(F): 98.2 Respiratory Rate 16 (breaths/min): Photos: [N/A:N/A] Wound Location: Left Back - Lateral, Distal N/A N/A Wounding Event: Thermal Burn N/A N/A Primary Etiology: 2nd degree Burn N/A N/A Comorbid History: Asthma, History of Burn, N/A N/A Osteoarthritis Date Acquired: 08/01/2015 N/A N/A Weeks of Treatment: 15 N/A N/A Wound Status: Healed - Epithelialized N/A N/A Measurements L x W x D 0x0x0 N/A N/A (cm) Area (cm) : 0 N/A N/A Volume (cm) : 0 N/A N/A % Reduction in Area: 100.00% N/A N/A % Reduction in Volume: 100.00% N/A N/A Classification: Partial Thickness N/A N/A Exudate Amount: Small N/A N/A Exudate Type: Serosanguineous N/A N/A Exudate Color: red, brown N/A N/A Wound Margin: Distinct,  outline attached N/A N/A Granulation Amount: Large (67-100%) N/A N/A Granulation Quality: Red, Pink N/A N/A Necrotic Amount: None Present (0%) N/A N/A Exposed Structures: Fascia: No N/A N/A Fat: No Liguori, Noe K. ([id number]) Tendon: No Muscle: No Joint: No Bone: No Limited to Skin Breakdown Epithelialization: Large (67-100%) N/A N/A Periwound Skin Texture: Scarring: Yes N/A  N/A Edema: No Excoriation: No Induration: No Callus: No Crepitus: No Fluctuance: No Friable: No Rash: No Periwound Skin Dry/Scaly: Yes N/A N/A Moisture: Maceration: No Moist: No Periwound Skin Color: Atrophie Blanche: No N/A N/A Cyanosis: No Ecchymosis: No Erythema: No Hemosiderin Staining: No Mottled: No Pallor: No Rubor: No Temperature: No Abnormality N/A N/A Tenderness on Yes N/A N/A Palpation: Wound Preparation: Ulcer Cleansing: N/A N/A Rinsed/Irrigated with Saline Topical Anesthetic Applied: None Treatment Notes Electronic Signature(s) Signed: 11/19/2015 1:56:10 PM By: Elliot Gurney, RN, BSN, Kim RN, BSN Entered By: Elliot Gurney, RN, BSN, Kim on 11/19/2015 12:45:20 Laurie [name] ([id number]) -------------------------------------------------------------------------------- Multi-Disciplinary Care Plan Details Patient Name: Laurie [name]. Date of Service: 11/19/2015 12:45 PM Medical Record Number: [id number] Patient Account Number: [id number] Date of Birth/Sex: 1955-04-23 (60 y.o. Female) Treating RN: Huel Coventry Primary Care Physician: Franco Nones Other Clinician: Referring Physician: Franco Nones Treating Physician/Extender: Rudene Re in Treatment: 15 Active Inactive Electronic Signature(s) Signed: 11/19/2015 1:56:10 PM By: Elliot Gurney, RN, BSN, Kim RN, BSN Entered By: Elliot Gurney, RN, BSN, Kim on 11/19/2015 12:45:12 Laurie [name] ([id number]) -------------------------------------------------------------------------------- Pain Assessment Details Patient Name: BEAULAH, ROMANEK. Date of Service: 11/19/2015 12:45 PM Medical Record Number: [id number] Patient Account Number: [id number] Date of Birth/Sex: 06/22/1955 (60 y.o. Female) Treating RN: Huel Coventry Primary Care Physician: Franco Nones Other Clinician: Referring Physician: Franco Nones Treating Physician/Extender: Rudene Re in Treatment: 15 Active Problems Location of Pain Severity and Description of Pain Patient Has Paino No Site Locations With Dressing Change: No Pain Management and Medication Current Pain Management: Electronic Signature(s) Signed: 11/19/2015 1:56:10 PM By: Elliot Gurney, RN, BSN, Kim RN, BSN Entered By: Elliot Gurney, RN, BSN, Kim on 11/19/2015 12:39:41 Laurie [name] ([id number]) -------------------------------------------------------------------------------- Patient/Caregiver Education Details Patient Name: Laurie [name]. Date of Service: 11/19/2015 12:45 PM Medical Record Number: [id number] Patient Account Number: [id number] Date of Birth/Gender: 08-18-1955 (60 y.o. Female) Treating RN: Huel Coventry Primary Care Physician: Franco Nones Other Clinician: Referring Physician: Franco Nones Treating Physician/Extender: Rudene Re in Treatment: 15 Education Assessment Education Provided To: Patient Education Topics Provided Psychologist, prison and probation services) Signed: 11/19/2015 1:56:10 PM By: Elliot Gurney, RN, BSN, Kim RN, BSN Entered By: Elliot Gurney, RN, BSN, Kim on 11/19/2015 12:47:19 Laurie [name] ([id number]) -------------------------------------------------------------------------------- Wound Assessment Details Patient Name: Laurie [name]. Date of Service: 11/19/2015 12:45 PM Medical Record Number: [id number] Patient Account Number: [id number] Date of Birth/Sex: 1955-11-05 (60 y.o. Female) Treating RN: Huel Coventry Primary Care Physician: Franco Nones Other Clinician: Referring Physician: Franco Nones Treating Physician/Extender: Rudene Re in  Treatment: 15 Wound Status Wound Number: 1 Primary 2nd degree Burn Etiology: Wound Location: Left Back - Lateral, Distal Wound Status: Healed - Epithelialized Wounding Event: Thermal Burn Comorbid Asthma, History of Burn, Date Acquired: 08/01/2015 History: Osteoarthritis Weeks Of Treatment: 15 Clustered Wound: No Photos Wound Measurements Length: (cm) Width: (cm) Depth: (cm) Area: (cm) Volume: (cm) 0 % Reduction in Area: 100% 0 % Reduction in Volume: 100% 0 Epithelialization: Large (67-100%) 0 0 Wound Description Classification: Partial Thickness Wound Margin: Distinct, outline attached Exudate Amount: Small Exudate Type: Serosanguineous Exudate Color: red, brown Foul Odor After Cleansing: No Wound Bed Granulation Amount: Large (67-100%) Exposed Structure Granulation Quality: Red, Pink Fascia Exposed: No Necrotic Amount: None Present (0%) Fat Layer Exposed: No Tendon Exposed: No Muscle Exposed: No Joint Exposed: No Bone Exposed: No Chronis, Joylynn K. ([id number]) Limited to Skin Breakdown Periwound Skin Texture Texture Color No Abnormalities Noted: No No Abnormalities Noted: No Callus: No Atrophie Blanche: No Crepitus: No Cyanosis: No Excoriation:  No Ecchymosis: No Fluctuance: No Erythema: No Friable: No Hemosiderin Staining: No Induration: No Mottled: No Localized Edema: No Pallor: No Rash: No Rubor: No Scarring: Yes Temperature / Pain Moisture Temperature: No Abnormality No Abnormalities Noted: No Tenderness on Palpation: Yes Dry / Scaly: Yes Maceration: No Moist: No Wound Preparation Ulcer Cleansing: Rinsed/Irrigated with Saline Topical Anesthetic Applied: None Electronic Signature(s) Signed: 11/19/2015 1:56:10 PM By: Elliot GurneyWoody, RN, BSN, Kim RN, BSN Entered By: Elliot GurneyWoody, RN, BSN, Kim on 11/19/2015 12:42:40 Laurie KernOBERSON, Valisha K. ([id number]) -------------------------------------------------------------------------------- Vitals Details Patient  Name: Laurie KernOBERSON, Levonne K. Date of Service: 11/19/2015 12:45 PM Medical Record Number: [id number] Patient Account Number: [id number] Date of Birth/Sex: [birth year] (60 y.o. Female) Treating RN: Huel CoventryWoody, Kim Primary Care Physician: Franco NonesLINDLEY, CHERYL Other Clinician: Referring Physician: Franco NonesLINDLEY, CHERYL Treating Physician/Extender: Rudene ReBritto, Errol Weeks in Treatment: 15 Vital Signs Time Taken: 12:40 Temperature (F): 98.2 Height (in): 64 Pulse (bpm): 108 Weight (lbs): 220 Respiratory Rate (breaths/min): 16 Body Mass Index (BMI): 37.8 Blood Pressure (mmHg): 128/76 Reference Range: 80 - 120 mg / dl Electronic Signature(s) Signed: 11/19/2015 1:56:10 PM By: Elliot GurneyWoody, RN, BSN, Kim RN, BSN Entered By: Elliot GurneyWoody, RN, BSN, Kim on 11/19/2015 12:40:59

## 2015-11-23 DIAGNOSIS — I10 Essential (primary) hypertension: Secondary | ICD-10-CM | POA: Diagnosis not present

## 2015-11-23 DIAGNOSIS — G8929 Other chronic pain: Secondary | ICD-10-CM | POA: Diagnosis not present

## 2015-11-23 DIAGNOSIS — M545 Low back pain: Secondary | ICD-10-CM | POA: Diagnosis not present

## 2015-11-23 DIAGNOSIS — M5440 Lumbago with sciatica, unspecified side: Secondary | ICD-10-CM | POA: Diagnosis not present

## 2015-11-23 DIAGNOSIS — G894 Chronic pain syndrome: Secondary | ICD-10-CM | POA: Diagnosis not present

## 2015-11-23 DIAGNOSIS — G8921 Chronic pain due to trauma: Secondary | ICD-10-CM | POA: Diagnosis not present

## 2015-11-23 DIAGNOSIS — F419 Anxiety disorder, unspecified: Secondary | ICD-10-CM | POA: Diagnosis not present

## 2015-11-23 DIAGNOSIS — E669 Obesity, unspecified: Secondary | ICD-10-CM | POA: Diagnosis not present

## 2015-12-01 DIAGNOSIS — J019 Acute sinusitis, unspecified: Secondary | ICD-10-CM | POA: Diagnosis not present

## 2015-12-01 DIAGNOSIS — M5489 Other dorsalgia: Secondary | ICD-10-CM | POA: Diagnosis not present

## 2015-12-01 DIAGNOSIS — M79604 Pain in right leg: Secondary | ICD-10-CM | POA: Diagnosis not present

## 2015-12-01 DIAGNOSIS — R600 Localized edema: Secondary | ICD-10-CM | POA: Diagnosis not present

## 2015-12-25 DIAGNOSIS — Z79899 Other long term (current) drug therapy: Secondary | ICD-10-CM | POA: Diagnosis not present

## 2015-12-25 DIAGNOSIS — M5136 Other intervertebral disc degeneration, lumbar region: Secondary | ICD-10-CM | POA: Diagnosis not present

## 2015-12-25 DIAGNOSIS — F419 Anxiety disorder, unspecified: Secondary | ICD-10-CM | POA: Diagnosis not present

## 2015-12-28 DIAGNOSIS — Z79899 Other long term (current) drug therapy: Secondary | ICD-10-CM | POA: Diagnosis not present

## 2016-01-26 DIAGNOSIS — M19079 Primary osteoarthritis, unspecified ankle and foot: Secondary | ICD-10-CM | POA: Diagnosis not present

## 2016-01-26 DIAGNOSIS — M7989 Other specified soft tissue disorders: Secondary | ICD-10-CM | POA: Diagnosis not present

## 2016-01-26 DIAGNOSIS — M25521 Pain in right elbow: Secondary | ICD-10-CM | POA: Diagnosis not present

## 2016-01-26 DIAGNOSIS — M25531 Pain in right wrist: Secondary | ICD-10-CM | POA: Diagnosis not present

## 2016-02-01 DIAGNOSIS — F419 Anxiety disorder, unspecified: Secondary | ICD-10-CM | POA: Diagnosis not present

## 2016-02-01 DIAGNOSIS — G8921 Chronic pain due to trauma: Secondary | ICD-10-CM | POA: Diagnosis not present

## 2016-02-01 DIAGNOSIS — M5440 Lumbago with sciatica, unspecified side: Secondary | ICD-10-CM | POA: Diagnosis not present

## 2016-02-01 DIAGNOSIS — I10 Essential (primary) hypertension: Secondary | ICD-10-CM | POA: Diagnosis not present

## 2016-02-01 DIAGNOSIS — E669 Obesity, unspecified: Secondary | ICD-10-CM | POA: Diagnosis not present

## 2016-02-01 DIAGNOSIS — M545 Low back pain: Secondary | ICD-10-CM | POA: Diagnosis not present

## 2016-02-01 DIAGNOSIS — G894 Chronic pain syndrome: Secondary | ICD-10-CM | POA: Diagnosis not present

## 2016-02-01 DIAGNOSIS — G8929 Other chronic pain: Secondary | ICD-10-CM | POA: Diagnosis not present

## 2016-02-03 ENCOUNTER — Other Ambulatory Visit: Payer: Self-pay | Admitting: Anesthesiology

## 2016-02-03 DIAGNOSIS — Z6841 Body Mass Index (BMI) 40.0 and over, adult: Secondary | ICD-10-CM | POA: Diagnosis not present

## 2016-02-03 DIAGNOSIS — M545 Low back pain: Secondary | ICD-10-CM | POA: Diagnosis not present

## 2016-02-03 DIAGNOSIS — R740 Nonspecific elevation of levels of transaminase and lactic acid dehydrogenase [LDH]: Secondary | ICD-10-CM | POA: Diagnosis not present

## 2016-02-03 DIAGNOSIS — J329 Chronic sinusitis, unspecified: Secondary | ICD-10-CM | POA: Diagnosis not present

## 2016-02-03 DIAGNOSIS — J40 Bronchitis, not specified as acute or chronic: Secondary | ICD-10-CM | POA: Diagnosis not present

## 2016-02-03 DIAGNOSIS — E669 Obesity, unspecified: Secondary | ICD-10-CM | POA: Diagnosis not present

## 2016-02-03 DIAGNOSIS — E6609 Other obesity due to excess calories: Secondary | ICD-10-CM | POA: Diagnosis not present

## 2016-02-03 DIAGNOSIS — E785 Hyperlipidemia, unspecified: Secondary | ICD-10-CM | POA: Diagnosis not present

## 2016-02-03 DIAGNOSIS — R911 Solitary pulmonary nodule: Secondary | ICD-10-CM | POA: Diagnosis not present

## 2016-02-03 DIAGNOSIS — K219 Gastro-esophageal reflux disease without esophagitis: Secondary | ICD-10-CM | POA: Diagnosis not present

## 2016-02-03 DIAGNOSIS — R062 Wheezing: Secondary | ICD-10-CM | POA: Diagnosis not present

## 2016-02-03 DIAGNOSIS — D696 Thrombocytopenia, unspecified: Secondary | ICD-10-CM | POA: Diagnosis not present

## 2016-02-03 DIAGNOSIS — M5440 Lumbago with sciatica, unspecified side: Secondary | ICD-10-CM

## 2016-02-11 ENCOUNTER — Ambulatory Visit: Payer: PPO

## 2016-03-01 ENCOUNTER — Ambulatory Visit: Payer: PPO

## 2016-03-07 DIAGNOSIS — G894 Chronic pain syndrome: Secondary | ICD-10-CM | POA: Diagnosis not present

## 2016-03-07 DIAGNOSIS — F1120 Opioid dependence, uncomplicated: Secondary | ICD-10-CM | POA: Diagnosis not present

## 2016-03-07 DIAGNOSIS — G8921 Chronic pain due to trauma: Secondary | ICD-10-CM | POA: Diagnosis not present

## 2016-03-07 DIAGNOSIS — I10 Essential (primary) hypertension: Secondary | ICD-10-CM | POA: Diagnosis not present

## 2016-03-07 DIAGNOSIS — G8929 Other chronic pain: Secondary | ICD-10-CM | POA: Diagnosis not present

## 2016-03-07 DIAGNOSIS — M545 Low back pain: Secondary | ICD-10-CM | POA: Diagnosis not present

## 2016-03-07 DIAGNOSIS — E669 Obesity, unspecified: Secondary | ICD-10-CM | POA: Diagnosis not present

## 2016-03-07 DIAGNOSIS — M5440 Lumbago with sciatica, unspecified side: Secondary | ICD-10-CM | POA: Diagnosis not present

## 2016-03-07 DIAGNOSIS — F419 Anxiety disorder, unspecified: Secondary | ICD-10-CM | POA: Diagnosis not present

## 2016-03-25 DIAGNOSIS — Z79899 Other long term (current) drug therapy: Secondary | ICD-10-CM | POA: Diagnosis not present

## 2016-03-25 DIAGNOSIS — E559 Vitamin D deficiency, unspecified: Secondary | ICD-10-CM | POA: Diagnosis not present

## 2016-03-25 DIAGNOSIS — J0100 Acute maxillary sinusitis, unspecified: Secondary | ICD-10-CM | POA: Diagnosis not present

## 2016-03-25 DIAGNOSIS — K219 Gastro-esophageal reflux disease without esophagitis: Secondary | ICD-10-CM | POA: Diagnosis not present

## 2016-03-25 DIAGNOSIS — F419 Anxiety disorder, unspecified: Secondary | ICD-10-CM | POA: Diagnosis not present

## 2016-05-04 DIAGNOSIS — E785 Hyperlipidemia, unspecified: Secondary | ICD-10-CM | POA: Diagnosis not present

## 2016-05-04 DIAGNOSIS — K635 Polyp of colon: Secondary | ICD-10-CM | POA: Diagnosis not present

## 2016-05-04 DIAGNOSIS — J329 Chronic sinusitis, unspecified: Secondary | ICD-10-CM | POA: Diagnosis not present

## 2016-05-04 DIAGNOSIS — R740 Nonspecific elevation of levels of transaminase and lactic acid dehydrogenase [LDH]: Secondary | ICD-10-CM | POA: Diagnosis not present

## 2016-05-04 DIAGNOSIS — Z79899 Other long term (current) drug therapy: Secondary | ICD-10-CM | POA: Diagnosis not present

## 2016-05-04 DIAGNOSIS — E6609 Other obesity due to excess calories: Secondary | ICD-10-CM | POA: Diagnosis not present

## 2016-05-04 DIAGNOSIS — G8929 Other chronic pain: Secondary | ICD-10-CM | POA: Diagnosis not present

## 2016-05-04 DIAGNOSIS — E039 Hypothyroidism, unspecified: Secondary | ICD-10-CM | POA: Diagnosis not present

## 2016-05-09 DIAGNOSIS — F419 Anxiety disorder, unspecified: Secondary | ICD-10-CM | POA: Diagnosis not present

## 2016-05-09 DIAGNOSIS — M545 Low back pain: Secondary | ICD-10-CM | POA: Diagnosis not present

## 2016-05-09 DIAGNOSIS — I10 Essential (primary) hypertension: Secondary | ICD-10-CM | POA: Diagnosis not present

## 2016-05-09 DIAGNOSIS — Z79899 Other long term (current) drug therapy: Secondary | ICD-10-CM | POA: Diagnosis not present

## 2016-05-09 DIAGNOSIS — M5440 Lumbago with sciatica, unspecified side: Secondary | ICD-10-CM | POA: Diagnosis not present

## 2016-05-09 DIAGNOSIS — G8929 Other chronic pain: Secondary | ICD-10-CM | POA: Diagnosis not present

## 2016-05-09 DIAGNOSIS — E669 Obesity, unspecified: Secondary | ICD-10-CM | POA: Diagnosis not present

## 2016-05-09 DIAGNOSIS — F1120 Opioid dependence, uncomplicated: Secondary | ICD-10-CM | POA: Diagnosis not present

## 2016-05-09 DIAGNOSIS — G8921 Chronic pain due to trauma: Secondary | ICD-10-CM | POA: Diagnosis not present

## 2016-05-09 DIAGNOSIS — G894 Chronic pain syndrome: Secondary | ICD-10-CM | POA: Diagnosis not present

## 2016-06-23 DIAGNOSIS — E669 Obesity, unspecified: Secondary | ICD-10-CM | POA: Diagnosis not present

## 2016-06-23 DIAGNOSIS — Z6841 Body Mass Index (BMI) 40.0 and over, adult: Secondary | ICD-10-CM | POA: Diagnosis not present

## 2016-06-23 DIAGNOSIS — Z79899 Other long term (current) drug therapy: Secondary | ICD-10-CM | POA: Diagnosis not present

## 2016-06-23 DIAGNOSIS — R784 Finding of other drugs of addictive potential in blood: Secondary | ICD-10-CM | POA: Diagnosis not present

## 2016-06-23 DIAGNOSIS — E7800 Pure hypercholesterolemia, unspecified: Secondary | ICD-10-CM | POA: Diagnosis not present

## 2016-06-23 DIAGNOSIS — F419 Anxiety disorder, unspecified: Secondary | ICD-10-CM | POA: Diagnosis not present

## 2016-06-23 DIAGNOSIS — K219 Gastro-esophageal reflux disease without esophagitis: Secondary | ICD-10-CM | POA: Diagnosis not present

## 2016-06-23 DIAGNOSIS — E559 Vitamin D deficiency, unspecified: Secondary | ICD-10-CM | POA: Diagnosis not present

## 2016-07-04 DIAGNOSIS — F419 Anxiety disorder, unspecified: Secondary | ICD-10-CM | POA: Diagnosis not present

## 2016-07-04 DIAGNOSIS — I10 Essential (primary) hypertension: Secondary | ICD-10-CM | POA: Diagnosis not present

## 2016-07-04 DIAGNOSIS — M5440 Lumbago with sciatica, unspecified side: Secondary | ICD-10-CM | POA: Diagnosis not present

## 2016-07-04 DIAGNOSIS — G894 Chronic pain syndrome: Secondary | ICD-10-CM | POA: Diagnosis not present

## 2016-07-04 DIAGNOSIS — G8921 Chronic pain due to trauma: Secondary | ICD-10-CM | POA: Diagnosis not present

## 2016-07-04 DIAGNOSIS — G8929 Other chronic pain: Secondary | ICD-10-CM | POA: Diagnosis not present

## 2016-07-04 DIAGNOSIS — E669 Obesity, unspecified: Secondary | ICD-10-CM | POA: Diagnosis not present

## 2016-07-04 DIAGNOSIS — M545 Low back pain: Secondary | ICD-10-CM | POA: Diagnosis not present

## 2016-08-25 DIAGNOSIS — M25531 Pain in right wrist: Secondary | ICD-10-CM | POA: Diagnosis not present

## 2016-08-26 DIAGNOSIS — Z1231 Encounter for screening mammogram for malignant neoplasm of breast: Secondary | ICD-10-CM | POA: Diagnosis not present

## 2016-08-29 DIAGNOSIS — E669 Obesity, unspecified: Secondary | ICD-10-CM | POA: Diagnosis not present

## 2016-08-29 DIAGNOSIS — F419 Anxiety disorder, unspecified: Secondary | ICD-10-CM | POA: Diagnosis not present

## 2016-08-29 DIAGNOSIS — G8929 Other chronic pain: Secondary | ICD-10-CM | POA: Diagnosis not present

## 2016-08-29 DIAGNOSIS — G8921 Chronic pain due to trauma: Secondary | ICD-10-CM | POA: Diagnosis not present

## 2016-08-29 DIAGNOSIS — I10 Essential (primary) hypertension: Secondary | ICD-10-CM | POA: Diagnosis not present

## 2016-08-29 DIAGNOSIS — M5440 Lumbago with sciatica, unspecified side: Secondary | ICD-10-CM | POA: Diagnosis not present

## 2016-08-29 DIAGNOSIS — G894 Chronic pain syndrome: Secondary | ICD-10-CM | POA: Diagnosis not present

## 2016-08-29 DIAGNOSIS — M545 Low back pain: Secondary | ICD-10-CM | POA: Diagnosis not present

## 2016-09-06 DIAGNOSIS — R5383 Other fatigue: Secondary | ICD-10-CM | POA: Diagnosis not present

## 2016-09-06 DIAGNOSIS — J019 Acute sinusitis, unspecified: Secondary | ICD-10-CM | POA: Diagnosis not present

## 2016-09-06 DIAGNOSIS — H9201 Otalgia, right ear: Secondary | ICD-10-CM | POA: Diagnosis not present

## 2016-09-06 DIAGNOSIS — F419 Anxiety disorder, unspecified: Secondary | ICD-10-CM | POA: Diagnosis not present

## 2016-09-06 DIAGNOSIS — Z7689 Persons encountering health services in other specified circumstances: Secondary | ICD-10-CM | POA: Diagnosis not present

## 2016-09-13 DIAGNOSIS — R5383 Other fatigue: Secondary | ICD-10-CM | POA: Diagnosis not present

## 2016-09-13 DIAGNOSIS — R740 Nonspecific elevation of levels of transaminase and lactic acid dehydrogenase [LDH]: Secondary | ICD-10-CM | POA: Diagnosis not present

## 2016-10-31 DIAGNOSIS — E669 Obesity, unspecified: Secondary | ICD-10-CM | POA: Diagnosis not present

## 2016-10-31 DIAGNOSIS — F419 Anxiety disorder, unspecified: Secondary | ICD-10-CM | POA: Diagnosis not present

## 2016-10-31 DIAGNOSIS — Z79899 Other long term (current) drug therapy: Secondary | ICD-10-CM | POA: Diagnosis not present

## 2016-10-31 DIAGNOSIS — I10 Essential (primary) hypertension: Secondary | ICD-10-CM | POA: Diagnosis not present

## 2016-10-31 DIAGNOSIS — G8929 Other chronic pain: Secondary | ICD-10-CM | POA: Diagnosis not present

## 2016-10-31 DIAGNOSIS — M5440 Lumbago with sciatica, unspecified side: Secondary | ICD-10-CM | POA: Diagnosis not present

## 2016-10-31 DIAGNOSIS — F1120 Opioid dependence, uncomplicated: Secondary | ICD-10-CM | POA: Diagnosis not present

## 2016-10-31 DIAGNOSIS — G8921 Chronic pain due to trauma: Secondary | ICD-10-CM | POA: Diagnosis not present

## 2016-10-31 DIAGNOSIS — M545 Low back pain: Secondary | ICD-10-CM | POA: Diagnosis not present

## 2016-12-06 DIAGNOSIS — E559 Vitamin D deficiency, unspecified: Secondary | ICD-10-CM | POA: Diagnosis not present

## 2016-12-06 DIAGNOSIS — Z79899 Other long term (current) drug therapy: Secondary | ICD-10-CM | POA: Diagnosis not present

## 2016-12-06 DIAGNOSIS — R825 Elevated urine levels of drugs, medicaments and biological substances: Secondary | ICD-10-CM | POA: Diagnosis not present

## 2016-12-06 DIAGNOSIS — F339 Major depressive disorder, recurrent, unspecified: Secondary | ICD-10-CM | POA: Diagnosis not present

## 2016-12-06 DIAGNOSIS — R5383 Other fatigue: Secondary | ICD-10-CM | POA: Diagnosis not present

## 2016-12-06 DIAGNOSIS — E7800 Pure hypercholesterolemia, unspecified: Secondary | ICD-10-CM | POA: Diagnosis not present

## 2016-12-06 DIAGNOSIS — F419 Anxiety disorder, unspecified: Secondary | ICD-10-CM | POA: Diagnosis not present

## 2016-12-19 ENCOUNTER — Other Ambulatory Visit: Payer: Self-pay

## 2016-12-19 ENCOUNTER — Ambulatory Visit: Payer: PPO | Admitting: Gastroenterology

## 2016-12-26 DIAGNOSIS — G8929 Other chronic pain: Secondary | ICD-10-CM | POA: Diagnosis not present

## 2016-12-26 DIAGNOSIS — Z79899 Other long term (current) drug therapy: Secondary | ICD-10-CM | POA: Diagnosis not present

## 2016-12-26 DIAGNOSIS — M5440 Lumbago with sciatica, unspecified side: Secondary | ICD-10-CM | POA: Diagnosis not present

## 2016-12-26 DIAGNOSIS — G894 Chronic pain syndrome: Secondary | ICD-10-CM | POA: Diagnosis not present

## 2016-12-26 DIAGNOSIS — F1120 Opioid dependence, uncomplicated: Secondary | ICD-10-CM | POA: Diagnosis not present

## 2016-12-26 DIAGNOSIS — G8921 Chronic pain due to trauma: Secondary | ICD-10-CM | POA: Diagnosis not present

## 2016-12-26 DIAGNOSIS — M545 Low back pain: Secondary | ICD-10-CM | POA: Diagnosis not present

## 2016-12-26 DIAGNOSIS — F419 Anxiety disorder, unspecified: Secondary | ICD-10-CM | POA: Diagnosis not present

## 2016-12-26 DIAGNOSIS — I10 Essential (primary) hypertension: Secondary | ICD-10-CM | POA: Diagnosis not present

## 2016-12-26 DIAGNOSIS — E669 Obesity, unspecified: Secondary | ICD-10-CM | POA: Diagnosis not present

## 2017-01-05 DIAGNOSIS — Z1211 Encounter for screening for malignant neoplasm of colon: Secondary | ICD-10-CM | POA: Diagnosis not present

## 2017-01-05 DIAGNOSIS — Z1212 Encounter for screening for malignant neoplasm of rectum: Secondary | ICD-10-CM | POA: Diagnosis not present

## 2017-01-23 ENCOUNTER — Encounter: Payer: Self-pay | Admitting: *Deleted

## 2017-01-23 ENCOUNTER — Other Ambulatory Visit: Payer: Self-pay

## 2017-01-23 ENCOUNTER — Ambulatory Visit: Payer: Self-pay | Admitting: Gastroenterology

## 2017-02-27 ENCOUNTER — Ambulatory Visit: Payer: Self-pay | Admitting: Gastroenterology

## 2017-02-27 DIAGNOSIS — F419 Anxiety disorder, unspecified: Secondary | ICD-10-CM | POA: Diagnosis not present

## 2017-02-27 DIAGNOSIS — J449 Chronic obstructive pulmonary disease, unspecified: Secondary | ICD-10-CM | POA: Diagnosis not present

## 2017-02-27 DIAGNOSIS — G8929 Other chronic pain: Secondary | ICD-10-CM | POA: Diagnosis not present

## 2017-02-27 DIAGNOSIS — M79603 Pain in arm, unspecified: Secondary | ICD-10-CM | POA: Diagnosis not present

## 2017-02-27 DIAGNOSIS — M5440 Lumbago with sciatica, unspecified side: Secondary | ICD-10-CM | POA: Diagnosis not present

## 2017-02-27 DIAGNOSIS — E669 Obesity, unspecified: Secondary | ICD-10-CM | POA: Diagnosis not present

## 2017-02-27 DIAGNOSIS — M545 Low back pain: Secondary | ICD-10-CM | POA: Diagnosis not present

## 2017-02-27 DIAGNOSIS — I10 Essential (primary) hypertension: Secondary | ICD-10-CM | POA: Diagnosis not present

## 2017-02-27 DIAGNOSIS — F1120 Opioid dependence, uncomplicated: Secondary | ICD-10-CM | POA: Diagnosis not present

## 2017-02-27 DIAGNOSIS — G894 Chronic pain syndrome: Secondary | ICD-10-CM | POA: Diagnosis not present

## 2017-02-27 DIAGNOSIS — G8921 Chronic pain due to trauma: Secondary | ICD-10-CM | POA: Diagnosis not present

## 2017-02-27 DIAGNOSIS — M549 Dorsalgia, unspecified: Secondary | ICD-10-CM | POA: Diagnosis not present

## 2017-02-27 DIAGNOSIS — Z79899 Other long term (current) drug therapy: Secondary | ICD-10-CM | POA: Diagnosis not present

## 2017-03-06 ENCOUNTER — Encounter (INDEPENDENT_AMBULATORY_CARE_PROVIDER_SITE_OTHER): Payer: Self-pay

## 2017-03-06 ENCOUNTER — Ambulatory Visit: Payer: Self-pay | Admitting: Gastroenterology

## 2017-03-06 ENCOUNTER — Other Ambulatory Visit: Payer: Self-pay

## 2017-03-20 DIAGNOSIS — J449 Chronic obstructive pulmonary disease, unspecified: Secondary | ICD-10-CM | POA: Diagnosis not present

## 2017-03-20 DIAGNOSIS — G894 Chronic pain syndrome: Secondary | ICD-10-CM | POA: Diagnosis not present

## 2017-03-20 DIAGNOSIS — M79603 Pain in arm, unspecified: Secondary | ICD-10-CM | POA: Diagnosis not present

## 2017-03-20 DIAGNOSIS — M549 Dorsalgia, unspecified: Secondary | ICD-10-CM | POA: Diagnosis not present

## 2017-03-20 DIAGNOSIS — Z79899 Other long term (current) drug therapy: Secondary | ICD-10-CM | POA: Diagnosis not present

## 2017-04-19 ENCOUNTER — Ambulatory Visit: Payer: Self-pay | Admitting: Gastroenterology

## 2017-04-20 DIAGNOSIS — Z20818 Contact with and (suspected) exposure to other bacterial communicable diseases: Secondary | ICD-10-CM | POA: Diagnosis not present

## 2017-04-20 DIAGNOSIS — Z112 Encounter for screening for other bacterial diseases: Secondary | ICD-10-CM | POA: Diagnosis not present

## 2017-04-20 DIAGNOSIS — E119 Type 2 diabetes mellitus without complications: Secondary | ICD-10-CM | POA: Diagnosis not present

## 2017-04-20 DIAGNOSIS — R300 Dysuria: Secondary | ICD-10-CM | POA: Diagnosis not present

## 2017-04-20 DIAGNOSIS — E7800 Pure hypercholesterolemia, unspecified: Secondary | ICD-10-CM | POA: Diagnosis not present

## 2017-04-20 DIAGNOSIS — R5382 Chronic fatigue, unspecified: Secondary | ICD-10-CM | POA: Diagnosis not present

## 2017-04-20 DIAGNOSIS — R7989 Other specified abnormal findings of blood chemistry: Secondary | ICD-10-CM | POA: Diagnosis not present

## 2017-04-20 DIAGNOSIS — Z0000 Encounter for general adult medical examination without abnormal findings: Secondary | ICD-10-CM | POA: Diagnosis not present

## 2017-04-20 DIAGNOSIS — R5383 Other fatigue: Secondary | ICD-10-CM | POA: Diagnosis not present

## 2017-04-20 DIAGNOSIS — M79603 Pain in arm, unspecified: Secondary | ICD-10-CM | POA: Diagnosis not present

## 2017-04-20 DIAGNOSIS — Z79899 Other long term (current) drug therapy: Secondary | ICD-10-CM | POA: Diagnosis not present

## 2017-04-20 DIAGNOSIS — I119 Hypertensive heart disease without heart failure: Secondary | ICD-10-CM | POA: Diagnosis not present

## 2017-04-20 DIAGNOSIS — G894 Chronic pain syndrome: Secondary | ICD-10-CM | POA: Diagnosis not present

## 2017-04-20 DIAGNOSIS — I10 Essential (primary) hypertension: Secondary | ICD-10-CM | POA: Diagnosis not present

## 2017-04-20 DIAGNOSIS — Z118 Encounter for screening for other infectious and parasitic diseases: Secondary | ICD-10-CM | POA: Diagnosis not present

## 2017-04-20 DIAGNOSIS — E108 Type 1 diabetes mellitus with unspecified complications: Secondary | ICD-10-CM | POA: Diagnosis not present

## 2017-05-03 ENCOUNTER — Other Ambulatory Visit: Payer: Self-pay | Admitting: Family Medicine

## 2017-05-03 ENCOUNTER — Ambulatory Visit
Admission: RE | Admit: 2017-05-03 | Discharge: 2017-05-03 | Disposition: A | Discharge status: Home / Self Care | Payer: Medicare HMO | Source: Ambulatory Visit | Attending: Family Medicine | Admitting: Family Medicine

## 2017-05-03 DIAGNOSIS — M5136 Other intervertebral disc degeneration, lumbar region: Secondary | ICD-10-CM | POA: Diagnosis not present

## 2017-05-03 DIAGNOSIS — M25512 Pain in left shoulder: Secondary | ICD-10-CM | POA: Diagnosis present

## 2017-05-03 DIAGNOSIS — R52 Pain, unspecified: Secondary | ICD-10-CM

## 2017-05-03 DIAGNOSIS — M542 Cervicalgia: Secondary | ICD-10-CM | POA: Diagnosis present

## 2018-01-03 ENCOUNTER — Other Ambulatory Visit: Payer: Self-pay | Admitting: Student

## 2018-01-07 ENCOUNTER — Ambulatory Visit
Admission: RE | Admit: 2018-01-07 | Discharge: 2018-01-07 | Disposition: A | Discharge status: Home / Self Care | Payer: Medicare HMO | Source: Ambulatory Visit | Attending: Student | Admitting: Student

## 2018-01-07 DIAGNOSIS — R162 Hepatomegaly with splenomegaly, not elsewhere classified: Secondary | ICD-10-CM | POA: Insufficient documentation

## 2018-01-07 DIAGNOSIS — K760 Fatty (change of) liver, not elsewhere classified: Secondary | ICD-10-CM | POA: Diagnosis not present

## 2018-01-07 LAB — POCT I-STAT CREATININE: Creatinine, Ser: 0.7 mg/dL (ref 0.44–1.00)

## 2018-01-07 MED ORDER — GADOBUTROL 1 MMOL/ML IV SOLN
9.0000 mL | Freq: Once | INTRAVENOUS | Status: AC | PRN
  Administered 2018-01-07: 9 mL via INTRAVENOUS

## 2018-01-15 ENCOUNTER — Other Ambulatory Visit: Payer: Self-pay | Admitting: Student

## 2018-01-15 DIAGNOSIS — R9389 Abnormal findings on diagnostic imaging of other specified body structures: Secondary | ICD-10-CM

## 2018-01-24 ENCOUNTER — Ambulatory Visit
Admission: RE | Admit: 2018-01-24 | Discharge: 2018-01-24 | Disposition: A | Discharge status: Home / Self Care | Payer: Medicare HMO | Source: Ambulatory Visit | Attending: Student | Admitting: Student

## 2018-01-24 DIAGNOSIS — N83201 Unspecified ovarian cyst, right side: Secondary | ICD-10-CM | POA: Insufficient documentation

## 2018-01-24 DIAGNOSIS — Z90710 Acquired absence of both cervix and uterus: Secondary | ICD-10-CM | POA: Insufficient documentation

## 2018-01-24 DIAGNOSIS — R9389 Abnormal findings on diagnostic imaging of other specified body structures: Secondary | ICD-10-CM | POA: Insufficient documentation

## 2018-01-30 ENCOUNTER — Telehealth: Payer: Self-pay | Admitting: *Deleted

## 2018-01-30 NOTE — Telephone Encounter (Signed)
Returned the patient's call regarding a referral to see Dr. Tamela OddiJackson-Moore. Patient already has appt set to see her 03/23/18

## 2018-02-12 ENCOUNTER — Ambulatory Visit: Payer: Self-pay | Admitting: Urology

## 2018-09-04 ENCOUNTER — Other Ambulatory Visit: Payer: Self-pay | Admitting: Student

## 2018-09-04 DIAGNOSIS — R110 Nausea: Secondary | ICD-10-CM

## 2018-09-04 DIAGNOSIS — R1013 Epigastric pain: Secondary | ICD-10-CM

## 2018-09-04 DIAGNOSIS — K219 Gastro-esophageal reflux disease without esophagitis: Secondary | ICD-10-CM

## 2018-09-10 ENCOUNTER — Other Ambulatory Visit: Payer: Self-pay

## 2018-09-10 ENCOUNTER — Other Ambulatory Visit: Payer: Self-pay | Admitting: Student

## 2018-09-10 ENCOUNTER — Ambulatory Visit
Admission: RE | Admit: 2018-09-10 | Discharge: 2018-09-10 | Disposition: A | Discharge status: Home / Self Care | Payer: Medicare HMO | Source: Ambulatory Visit | Attending: Student | Admitting: Student

## 2018-09-10 DIAGNOSIS — R110 Nausea: Secondary | ICD-10-CM | POA: Diagnosis not present

## 2018-09-10 DIAGNOSIS — K219 Gastro-esophageal reflux disease without esophagitis: Secondary | ICD-10-CM | POA: Diagnosis present

## 2018-09-10 DIAGNOSIS — R1013 Epigastric pain: Secondary | ICD-10-CM

## 2018-09-20 ENCOUNTER — Encounter: Payer: Self-pay | Admitting: Urology

## 2018-09-21 ENCOUNTER — Telehealth: Payer: Self-pay

## 2018-09-21 NOTE — Telephone Encounter (Signed)
Spoke with patient she has been having left lower flank pain for a few weeks to a month. She denies any urinary symptoms. She states the stone was an incidental finding but she is worried that it will move and would like it addressed. She believes that it may be the cause of her pain. She was notified that the stone is non obstructive and should not be causing pain. She would like a follow up to discuss stone management

## 2018-09-21 NOTE — Telephone Encounter (Signed)
Patient was notified to keep apt with Dr. Erlene Quan in September for stone management discussion. She may call if symptoms worsen for a sooner appointment. Patient is in agreement with this plan

## 2018-10-04 ENCOUNTER — Encounter: Payer: Self-pay | Admitting: Urology

## 2018-10-08 ENCOUNTER — Telehealth: Payer: Self-pay

## 2018-10-08 NOTE — Telephone Encounter (Signed)
Called patient in regards to [employer], no answer  Patient is a new patient if she is in pain she should go to ED for management, we will place on wait list for a sooner appointment for follow up

## 2018-10-09 NOTE — Telephone Encounter (Signed)
Patient notified and voiced understanding. She will come to the office to get a strainer.

## 2018-10-23 ENCOUNTER — Ambulatory Visit: Payer: Self-pay | Admitting: Urology

## 2018-10-24 ENCOUNTER — Ambulatory Visit: Payer: Self-pay | Admitting: Urology

## 2018-11-07 ENCOUNTER — Ambulatory Visit: Admit: 2018-11-07 | Payer: Medicare HMO | Admitting: Internal Medicine

## 2018-11-07 SURGERY — ESOPHAGOGASTRODUODENOSCOPY (EGD) WITH PROPOFOL
Anesthesia: General

## 2018-11-12 IMAGING — CR DG SHOULDER 2+V*L*
1 series · 3 of 3 positions shown · non-contrast
Comparison: None.

CLINICAL DATA: Neck and left shoulder pain for 2 months.

EXAM:
LEFT SHOULDER - 2+ VIEW

[Series 1: dg shoulder left · 0.14mm/px · 3 of 3 slices shown]
[im 1/3]
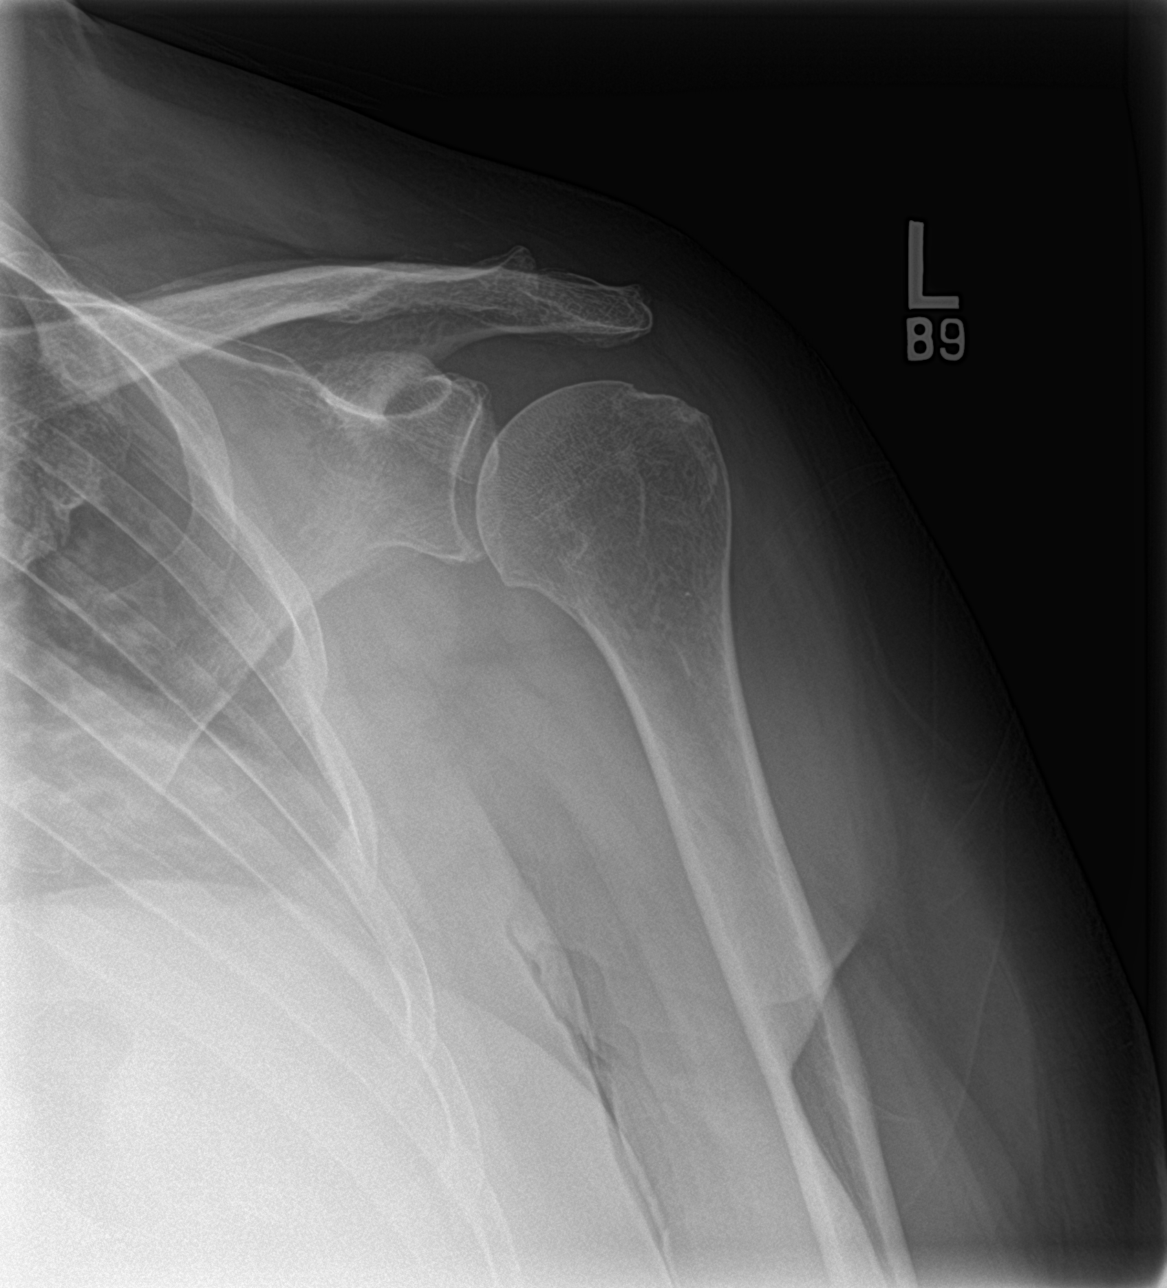
[im 2/3]
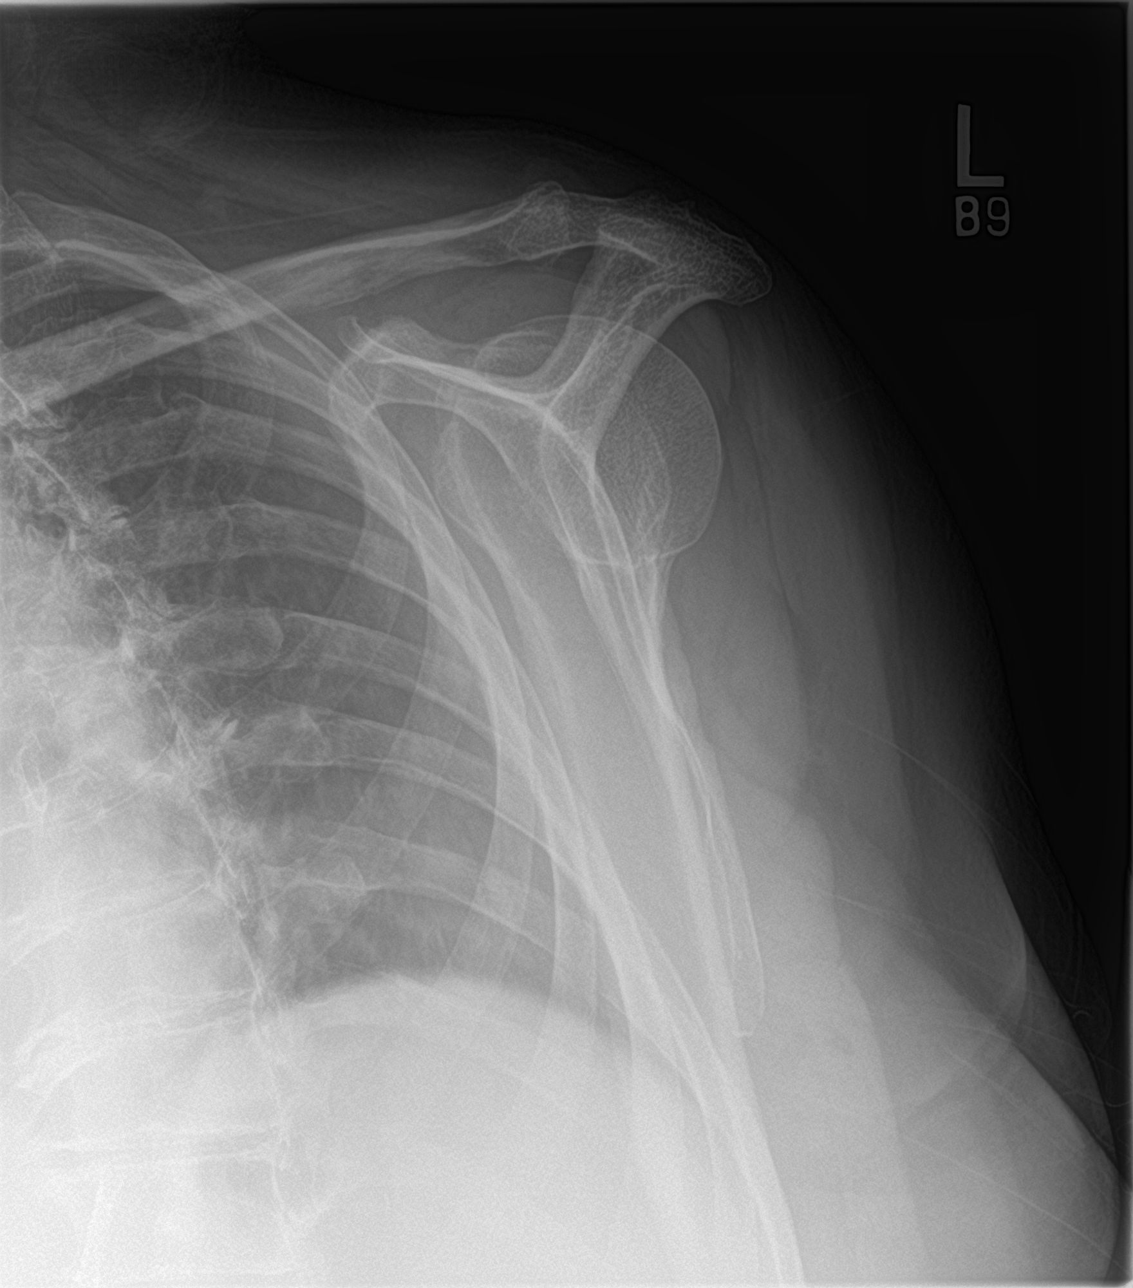
[im 3/3]
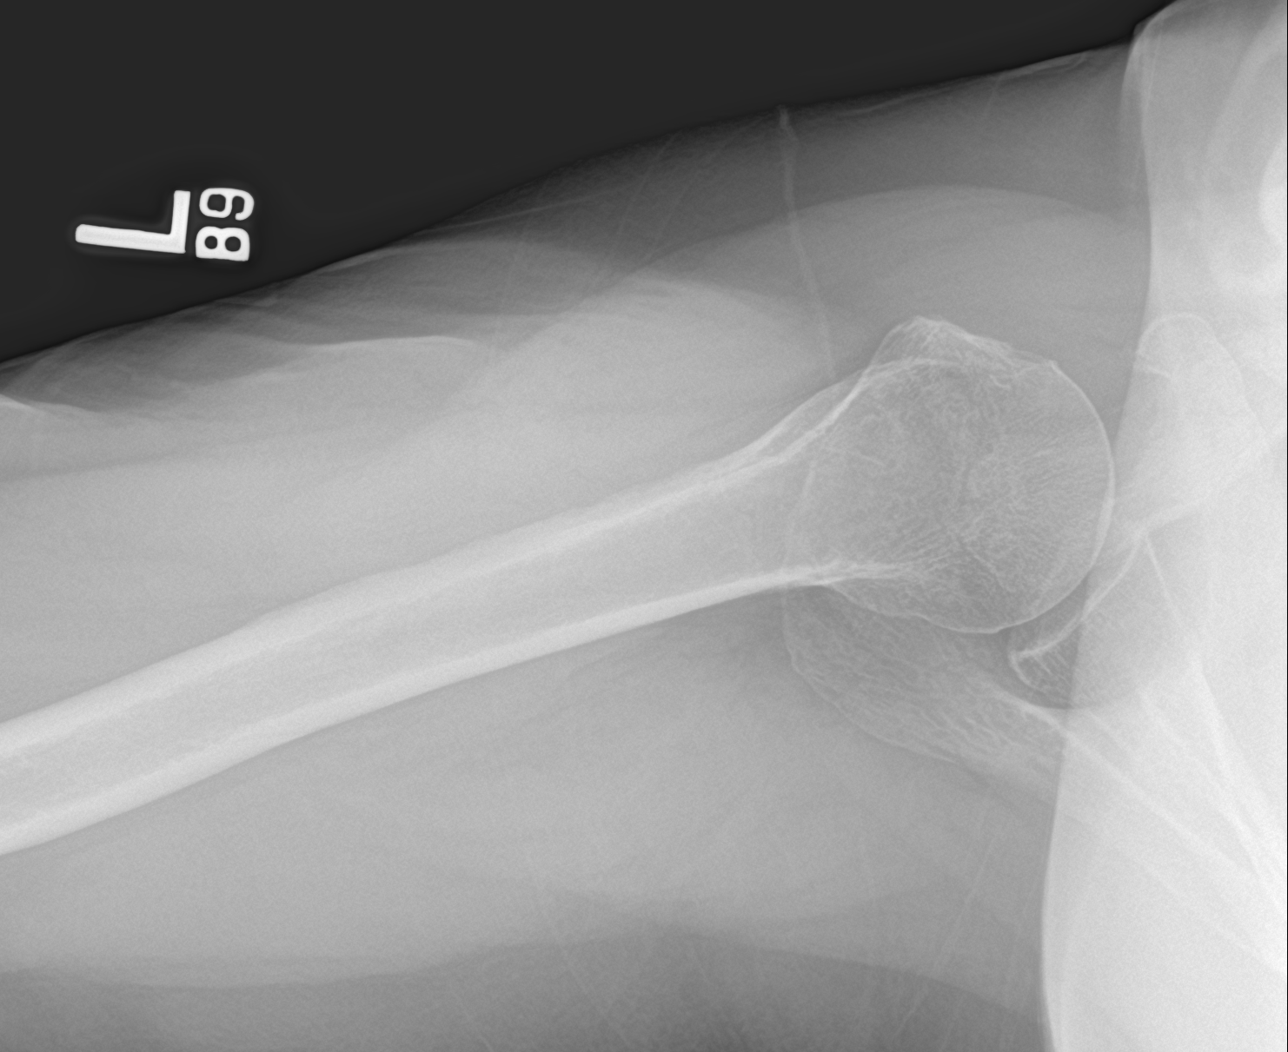

[3 of 3 positions shown; findings below may reference images not displayed]

FINDINGS: There is no evidence of fracture or dislocation. There is no
evidence of arthropathy or other focal bone abnormality. Soft
tissues are unremarkable.
IMPRESSION: Normal left shoulder.

## 2018-11-12 IMAGING — CR DG CERVICAL SPINE 2 OR 3 VIEWS
1 series · 4 of 4 positions shown · non-contrast
Comparison: CT scan of September 21, 2014.

CLINICAL DATA: Neck and left shoulder pain for 2 months.

EXAM:
CERVICAL SPINE - 2-3 VIEW

[Series 1: dg cervical spine 2 or 3 views · 0.14mm/px · 4 of 4 slices shown]
[im 1/4]
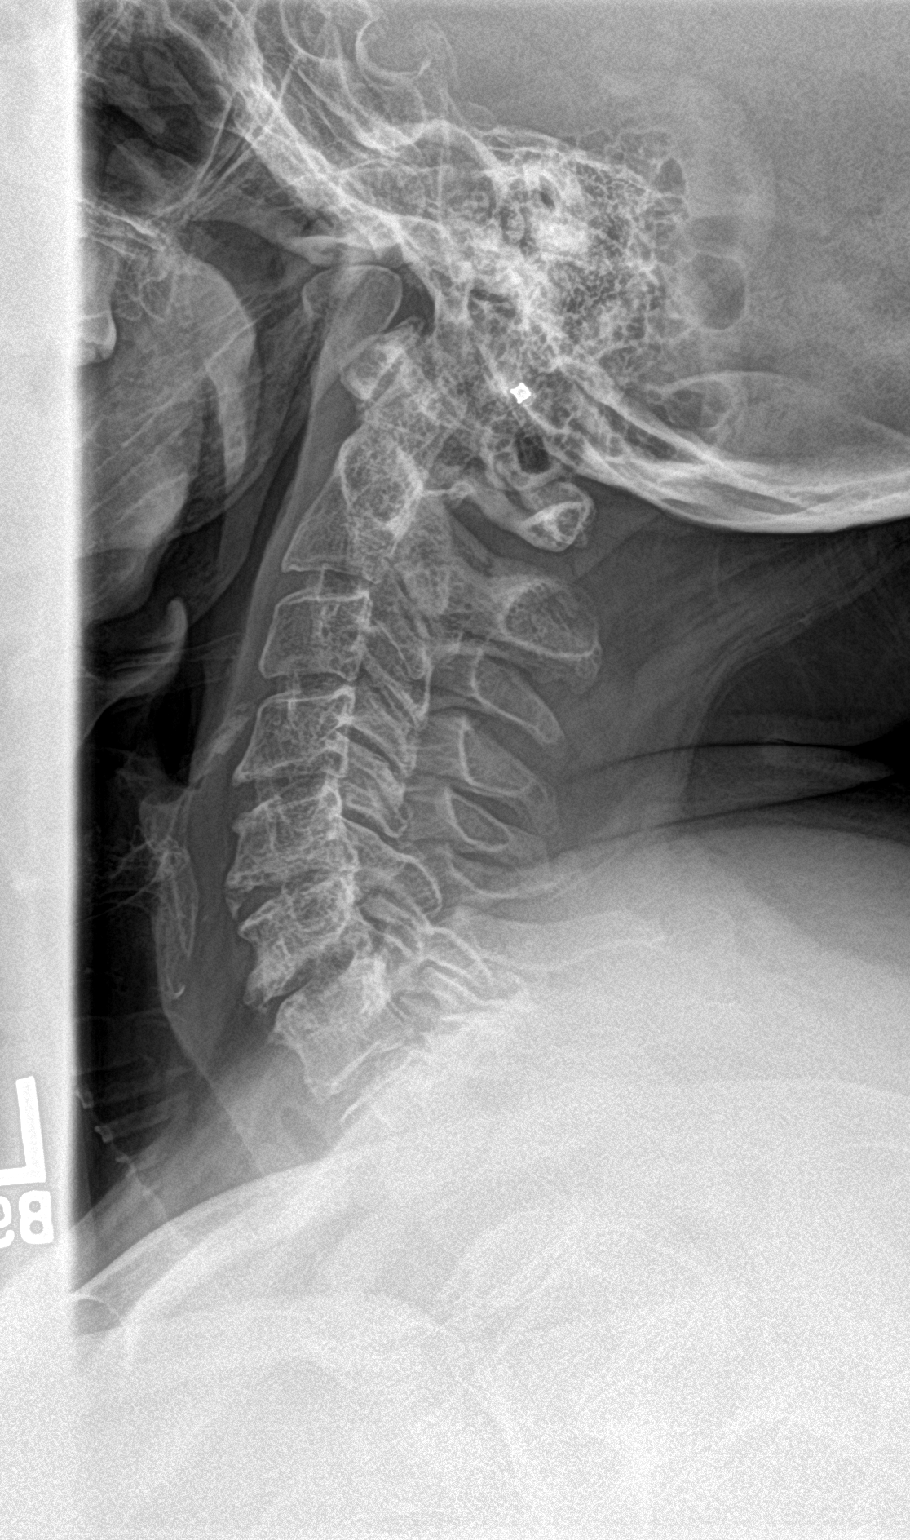
[im 2/4]
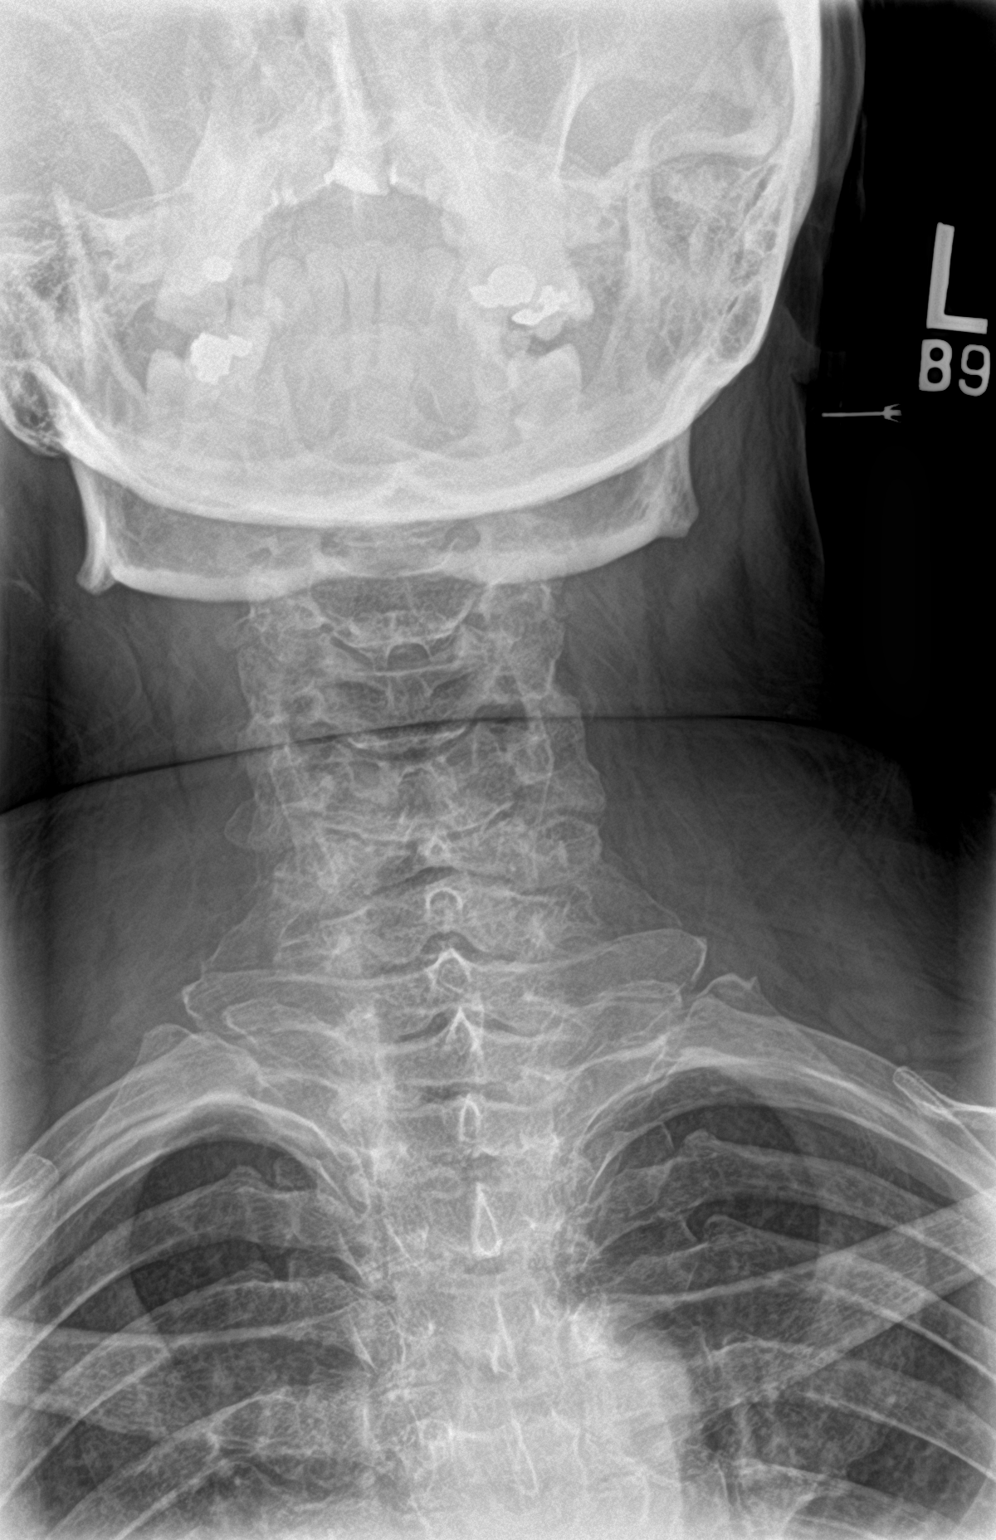
[im 3/4]
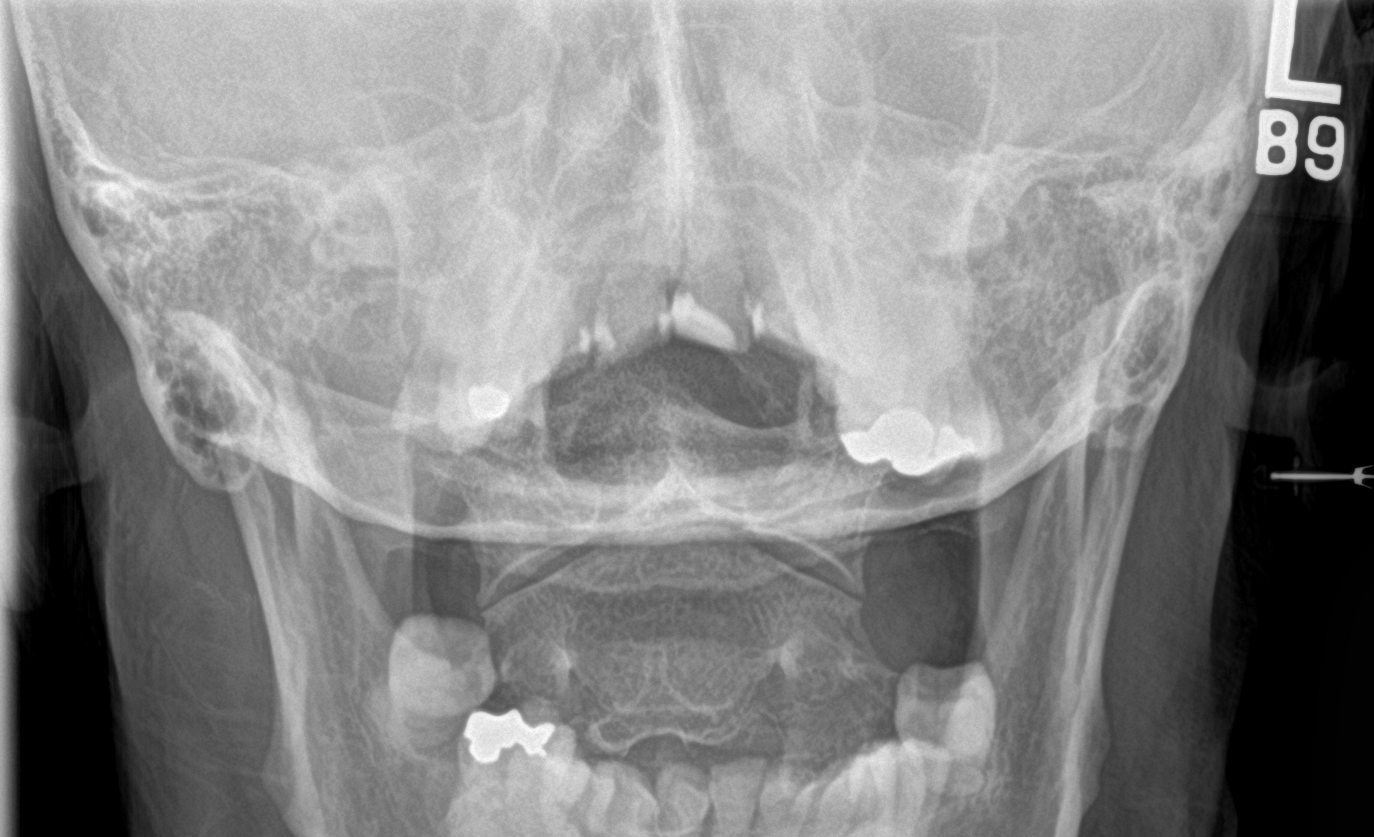
[im 4/4]
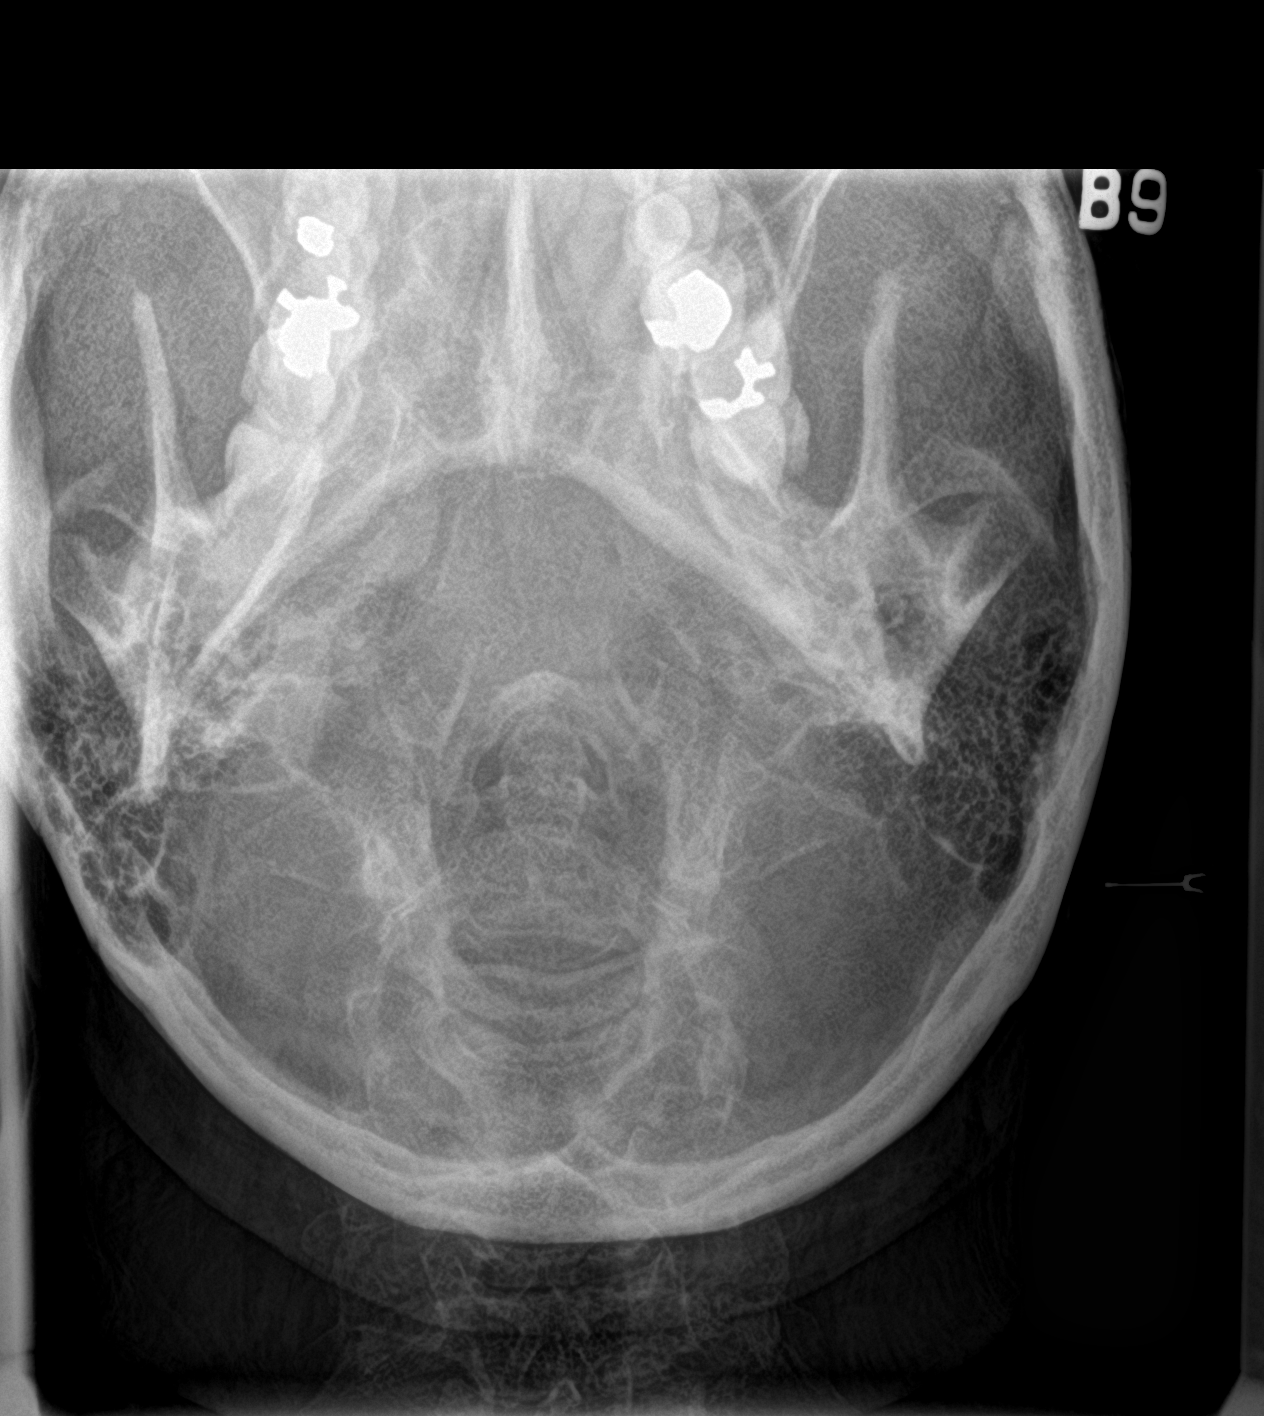

[4 of 4 positions shown; findings below may reference images not displayed]

FINDINGS: No fracture or spondylolisthesis is noted. Moderate degenerative
disc disease is noted at C5-6 and C6-7 with anterior osteophyte
formation. No prevertebral soft tissue swelling is noted.
IMPRESSION: Multilevel degenerative disc disease. No acute abnormality seen in
the cervical spine.

## 2018-11-21 ENCOUNTER — Ambulatory Visit: Payer: Medicare HMO | Admitting: Urology

## 2019-05-18 NOTE — Progress Notes (Deleted)
Laurie [name]  [phone] [phone]) [phone]  ID: [id number] OB: 1955/07/23  MR#: [id number]  STM#:[id number]  Patient Care Team: Remi Haggard, FNP as PCP - General (Family Medicine)  CHIEF COMPLAINT: Hereditary hemochromatosis.  INTERVAL HISTORY: ***  REVIEW OF SYSTEMS:   ROS  As per HPI. Otherwise, a complete review of systems is negative.  PAST MEDICAL HISTORY: Past Medical History:  Diagnosis Date  . Depression   . Diabetes mellitus    diet controlled  . Hyperlipidemia   . Hypertension   . Panic attack   . Restless leg syndrome   . Treadmill stress test negative for angina pectoris 2009    PAST SURGICAL HISTORY: Past Surgical History:  Procedure Laterality Date  . ABDOMINAL HYSTERECTOMY  1984  . BREAST LUMPECTOMY  2009   left breast  . HAND SURGERY     right hand  . KNEE SURGERY     left knee  . TONSILLECTOMY      FAMILY HISTORY: No family history on file.  ADVANCED DIRECTIVES (Y/N):  N  HEALTH MAINTENANCE: Social History   Tobacco Use  . Smoking status: Never Smoker  Substance Use Topics  . Alcohol use: No  . Drug use: Not on file     Colonoscopy:  PAP:  Bone density:  Lipid panel:  Allergies  Allergen Reactions  . Ivp Dye [Iodinated Diagnostic Agents]   . Latex   . Sulfa Drugs Cross Reactors   . Sulfa Antibiotics Rash    Current Outpatient Medications  Medication Sig Dispense Refill  . ALPRAZolam (XANAX) 1 MG tablet Take 1 mg by mouth.    Marland Kitchen aspirin 81 MG tablet Take 81 mg by mouth daily.    Marland Kitchen ezetimibe (ZETIA) 10 MG tablet Take 10 mg by mouth daily.  2  . furosemide (LASIX) 20 MG tablet Take 20 mg by mouth.    . gabapentin (NEURONTIN) 300 MG capsule TAKE ONE CAPSULE BY MOUTH EVERY 8 HOURS  5  . glucose blood (FREESTYLE TEST STRIPS) test strip by Other route Two (2) times a day.    . levothyroxine (SYNTHROID, LEVOTHROID) 50 MCG tablet TAKE 1 TABLET (50 MCG TOTAL) BY MOUTH DAILY AT 0600.  3    . morphine (MS CONTIN) 15 MG 12 hr tablet Take 15 mg by mouth every 8 (eight) hours as needed.  0  . morphine (MSIR) 15 MG tablet Take 15 mg by mouth 3 (three) times daily.    . sertraline (ZOLOFT) 100 MG tablet Take 100 mg by mouth daily.  1  . SERTRALINE HCL PO Take 300 mg by mouth daily.     No current facility-administered medications for this visit.    OBJECTIVE: There were no vitals filed for this visit.   There is no height or weight on file to calculate BMI.    ECOG FS:{CHL ONC [id number]  General: Well-developed, well-nourished, no acute distress. Eyes: Pink conjunctiva, anicteric sclera. HEENT: Normocephalic, moist mucous membranes. Lungs: No audible wheezing or coughing. Heart: Regular rate and rhythm. Abdomen: Soft, nontender, no obvious distention. Musculoskeletal: No edema, cyanosis, or clubbing. Neuro: Alert, answering all questions appropriately. Cranial nerves grossly intact. Skin: No rashes or petechiae noted. Psych: Normal affect. Lymphatics: No cervical, calvicular, axillary or inguinal LAD.   LAB RESULTS:  Lab Results  Component Value Date   NA 139 10/19/2011   K 3.5 10/19/2011   CL 105 10/19/2011   CO2 24 10/19/2011   GLUCOSE 114 (H) 10/19/2011  BUN 7 10/19/2011   CREATININE 0.70 01/07/2018   CALCIUM 8.6 10/19/2011   PROT 7.4 10/21/2011   ALBUMIN 3.4 10/21/2011   AST 155 (H) 10/21/2011   ALT 336 (H) 10/21/2011   ALKPHOS 214 (H) 10/21/2011   BILITOT 1.1 (H) 10/21/2011   GFRNONAA >60 10/19/2011   GFRAA >60 10/19/2011    Lab Results  Component Value Date   WBC 20.5 (H) 10/21/2011   NEUTROABS 17.7 (H) 10/21/2011   HGB 14.2 10/21/2011   HCT 40.5 10/21/2011   MCV 91 10/21/2011   PLT 200 10/21/2011     STUDIES: No results found.  ASSESSMENT: Hereditary hemochromatosis:   PLAN:    1. Hereditary hemochromatosis:  Patient expressed understanding and was in agreement with this plan. She also understands that She can call clinic at  any time with any questions, concerns, or complaints.   Cancer Staging No matching staging information was found for the patient.  Jeralyn Ruths, MD   05/18/2019 8:54 AM

## 2019-05-20 ENCOUNTER — Inpatient Hospital Stay: Payer: Medicare HMO | Admitting: Oncology

## 2019-07-11 ENCOUNTER — Encounter (INDEPENDENT_AMBULATORY_CARE_PROVIDER_SITE_OTHER): Payer: Self-pay | Admitting: Vascular Surgery

## 2019-08-05 ENCOUNTER — Encounter (INDEPENDENT_AMBULATORY_CARE_PROVIDER_SITE_OTHER): Payer: Self-pay | Admitting: Vascular Surgery

## 2019-09-01 DIAGNOSIS — M1712 Unilateral primary osteoarthritis, left knee: Secondary | ICD-10-CM | POA: Insufficient documentation

## 2019-09-01 DIAGNOSIS — R161 Splenomegaly, not elsewhere classified: Secondary | ICD-10-CM | POA: Insufficient documentation

## 2019-09-02 ENCOUNTER — Ambulatory Visit (INDEPENDENT_AMBULATORY_CARE_PROVIDER_SITE_OTHER): Payer: Medicare HMO | Admitting: Vascular Surgery

## 2019-09-02 ENCOUNTER — Other Ambulatory Visit: Payer: Self-pay

## 2019-09-02 ENCOUNTER — Encounter (INDEPENDENT_AMBULATORY_CARE_PROVIDER_SITE_OTHER): Payer: Self-pay | Admitting: Vascular Surgery

## 2019-09-02 VITALS — BP 113/76 | HR 86 | Resp 16 | Ht 64.0 in | Wt 222.0 lb

## 2019-09-02 DIAGNOSIS — E782 Mixed hyperlipidemia: Secondary | ICD-10-CM

## 2019-09-02 DIAGNOSIS — M8949 Other hypertrophic osteoarthropathy, multiple sites: Secondary | ICD-10-CM | POA: Diagnosis not present

## 2019-09-02 DIAGNOSIS — M79669 Pain in unspecified lower leg: Secondary | ICD-10-CM

## 2019-09-02 DIAGNOSIS — M159 Polyosteoarthritis, unspecified: Secondary | ICD-10-CM

## 2019-09-02 DIAGNOSIS — I872 Venous insufficiency (chronic) (peripheral): Secondary | ICD-10-CM | POA: Diagnosis not present

## 2019-09-02 DIAGNOSIS — M7989 Other specified soft tissue disorders: Secondary | ICD-10-CM

## 2019-09-09 ENCOUNTER — Encounter (INDEPENDENT_AMBULATORY_CARE_PROVIDER_SITE_OTHER): Payer: Self-pay | Admitting: Vascular Surgery

## 2019-09-09 DIAGNOSIS — I872 Venous insufficiency (chronic) (peripheral): Secondary | ICD-10-CM | POA: Insufficient documentation

## 2019-09-09 DIAGNOSIS — M79669 Pain in unspecified lower leg: Secondary | ICD-10-CM | POA: Insufficient documentation

## 2019-09-09 NOTE — Progress Notes (Signed)
MRN : [id number]  Laurie [name] is a 64 y.o. ([birth year]) female who presents with chief complaint of  Chief Complaint  Patient presents with  . New Patient (Initial Visit)    ref Clint Guy le pain  .  History of Present Illness:   Patient is seen for evaluation of leg pain and leg swelling. The patient first noticed the swelling remotely. The swelling is associated with pain and discoloration. The pain and swelling worsens with prolonged dependency and improves with elevation. The pain is unrelated to activity.  The patient notes that in the morning the legs are significantly improved but they steadily worsened throughout the course of the day. The patient also notes a steady worsening of the discoloration in the ankle and shin area.   The patient denies claudication symptoms.  The patient denies symptoms consistent with rest pain.  The patient denies and extensive history of DJD and LS spine disease.  The patient has no had any past angiography, interventions or vascular surgery.  Elevation makes the leg symptoms better, dependency makes them much worse. There is no history of ulcerations. The patient denies any recent changes in medications.  The patient has been wearing graduated compression but she notes it doesn't help much.  The patient denies a history of DVT or PE. There is no prior history of phlebitis. There is no history of primary lymphedema.  No history of malignancies. No history of trauma or groin or pelvic surgery. There is no history of radiation treatment to the groin or pelvis  The patient denies amaurosis fugax or recent TIA symptoms. There are no recent neurological changes noted. The patient denies recent episodes of angina or shortness of breath  Current Meds  Medication Sig  . ALPRAZolam (XANAX) 1 MG tablet Take 1 mg by mouth.  Marland Kitchen aspirin 81 MG tablet Take 81 mg by mouth daily.  Marland Kitchen ezetimibe (ZETIA) 10 MG tablet Take 10 mg by mouth daily.  . furosemide  (LASIX) 20 MG tablet Take 20 mg by mouth.  . gabapentin (NEURONTIN) 300 MG capsule TAKE ONE CAPSULE BY MOUTH EVERY 8 HOURS  . glucose blood (FREESTYLE TEST STRIPS) test strip by Other route Two (2) times a day.  . morphine (MSIR) 30 MG tablet Take 30 mg by mouth in the morning and at bedtime.  . sertraline (ZOLOFT) 100 MG tablet Take 100 mg by mouth daily.    Past Medical History:  Diagnosis Date  . Depression   . Diabetes mellitus    diet controlled  . Hyperlipidemia   . Hypertension   . Panic attack   . Restless leg syndrome   . Treadmill stress test negative for angina pectoris 2009    Past Surgical History:  Procedure Laterality Date  . ABDOMINAL HYSTERECTOMY  1984  . BREAST LUMPECTOMY  2009   left breast  . HAND SURGERY     right hand  . KNEE SURGERY     left knee  . TONSILLECTOMY      Social History Social History   Tobacco Use  . Smoking status: Never Smoker  . Smokeless tobacco: Never Used  Substance Use Topics  . Alcohol use: No  . Drug use: Not on file    Family History Family History  Problem Relation Age of Onset  . Hypertension Mother   . COPD Mother   . Heart attack Father   . Kidney cancer Father   . Heart attack Brother   . Hypertension Brother   .  Lymphoma Brother   . COPD Brother   . Hypertension Brother   No family history of bleeding/clotting disorders, porphyria or autoimmune disease   Allergies  Allergen Reactions  . Ivp Dye [Iodinated Diagnostic Agents]   . Latex   . Sulfa Drugs Cross Reactors   . Sulfa Antibiotics Rash     REVIEW OF SYSTEMS (Negative unless checked)  Constitutional: [] Weight loss  [] Fever  [] Chills Cardiac: [] Chest pain   [] Chest pressure   [] Palpitations   [] Shortness of breath when laying flat   [] Shortness of breath with exertion. Vascular:  [] Pain in legs with walking   [x] Pain in legs at rest  [] History of DVT   [] Phlebitis   [x] Swelling in legs   [x] Varicose veins   [] Non-healing ulcers Pulmonary:    [] Uses home oxygen   [] Productive cough   [] Hemoptysis   [] Wheeze  [] COPD   [] Asthma Neurologic:  [] Dizziness   [] Seizures   [] History of stroke   [] History of TIA  [] Aphasia   [] Vissual changes   [] Weakness or numbness in arm   [] Weakness or numbness in leg Musculoskeletal:   [] Joint swelling   [x] Joint pain   [] Low back pain Hematologic:  [] Easy bruising  [] Easy bleeding   [] Hypercoagulable state   [] Anemic Gastrointestinal:  [] Diarrhea   [] Vomiting  [x] Gastroesophageal reflux/heartburn   [] Difficulty swallowing. Genitourinary:  [] Chronic kidney disease   [] Difficult urination  [] Frequent urination   [] Blood in urine Skin:  [] Rashes   [] Ulcers  Psychological:  [] History of anxiety   []  History of major depression.  Physical Examination  Vitals:   09/02/19 1458  BP: 113/76  Pulse: 86  Resp: 16  Weight: 222 lb (100.7 kg)  Height: 5\' 4"  (1.626 m)   Body mass index is 38.11 kg/m. Gen: WD/WN, NAD Head: [place]/AT, No temporalis wasting.  Ear/Nose/Throat: Hearing grossly intact, nares w/o erythema or drainage, poor dentition Eyes: PER, EOMI, sclera nonicteric.  Neck: Supple, no masses.  No bruit or JVD.  Pulmonary:  Good air movement, clear to auscultation bilaterally, no use of accessory muscles.  Cardiac: RRR, normal S1, S2, no Murmurs. Vascular: scattered varicosities present bilaterally.  Moderate venous stasis changes to the legs bilaterally.  3+ soft pitting edema. Vessel Right Left  Radial Palpable Palpable  PT Palpable Palpable  DP Palpable Palpable  Gastrointestinal: soft, non-distended. No guarding/no peritoneal signs.  Musculoskeletal: M/S 5/5 throughout.  No deformity or atrophy.  Neurologic: CN 2-12 intact. Pain and light touch intact in extremities.  Symmetrical.  Speech is fluent. Motor exam as listed above. Psychiatric: Judgment intact, Mood & affect appropriate for pt's clinical situation. Dermatologic: No rashes or ulcers noted.  No changes consistent with  cellulitis.  CBC Lab Results  Component Value Date   WBC 20.5 (H) 10/21/2011   HGB 14.2 10/21/2011   HCT 40.5 10/21/2011   MCV 91 10/21/2011   PLT 200 10/21/2011    BMET    Component Value Date/Time   NA 139 10/19/2011 0442   K 3.5 10/19/2011 0442   CL 105 10/19/2011 0442   CO2 24 10/19/2011 0442   GLUCOSE 114 (H) 10/19/2011 0442   BUN 7 10/19/2011 0442   CREATININE 0.70 01/07/2018 1540   CREATININE 0.71 10/19/2011 0442   CALCIUM 8.6 10/19/2011 0442   GFRNONAA >60 10/19/2011 0442   GFRAA >60 10/19/2011 0442   CrCl cannot be calculated (Patient's most recent lab result is older than the maximum 21 days allowed.).  COAG No results found for: INR, PROTIME  Radiology No results found.    Assessment/Plan 1. Pain and swelling of lower leg, unspecified laterality I have had a long discussion with the patient regarding swelling and why it  causes symptoms.  Patient will begin wearing graduated compression stockings class 1 (20-30 mmHg) on a daily basis a prescription was given. The patient will  beginning wearing the stockings first thing in the morning and removing them in the evening. The patient is instructed specifically not to sleep in the stockings.   In addition, behavioral modification will be initiated.  This will include frequent elevation, use of over the counter pain medications and exercise such as walking.  I have reviewed systemic causes for chronic edema such as liver, kidney and cardiac etiologies.  The patient denies problems with these organ systems.    Consideration for a lymph pump will also be made based upon the effectiveness of conservative therapy.  This would help to improve the edema control and prevent sequela such as ulcers and infections   Patient should undergo duplex ultrasound of the venous system to ensure that DVT or reflux is not present.  The patient will follow-up with me after the ultrasound.   - VAS [place] LOWER EXTREMITY VENOUS REFLUX;  Future  2. Chronic venous insufficiency I have had a long discussion with the patient regarding swelling and why it  causes symptoms.  Patient will begin wearing graduated compression stockings class 1 (20-30 mmHg) on a daily basis a prescription was given. The patient will  beginning wearing the stockings first thing in the morning and removing them in the evening. The patient is instructed specifically not to sleep in the stockings.   In addition, behavioral modification will be initiated.  This will include frequent elevation, use of over the counter pain medications and exercise such as walking.  I have reviewed systemic causes for chronic edema such as liver, kidney and cardiac etiologies.  The patient denies problems with these organ systems.    Consideration for a lymph pump will also be made based upon the effectiveness of conservative therapy.  This would help to improve the edema control and prevent sequela such as ulcers and infections   Patient should undergo duplex ultrasound of the venous system to ensure that DVT or reflux is not present.  The patient will follow-up with me after the ultrasound.    3. Primary osteoarthritis involving multiple joints Continue NSAID medications as already ordered, these medications have been reviewed and there are no changes at this time.  Continued activity and therapy was stressed.   4. Mixed hyperlipidemia Continue statin as ordered and reviewed, no changes at this time    Levora Dredge, MD  09/09/2019 10:51 AM

## 2019-09-25 ENCOUNTER — Encounter (INDEPENDENT_AMBULATORY_CARE_PROVIDER_SITE_OTHER): Payer: Self-pay

## 2019-09-26 ENCOUNTER — Encounter (INDEPENDENT_AMBULATORY_CARE_PROVIDER_SITE_OTHER): Payer: Self-pay

## 2019-12-03 ENCOUNTER — Other Ambulatory Visit (INDEPENDENT_AMBULATORY_CARE_PROVIDER_SITE_OTHER): Payer: Self-pay | Admitting: Vascular Surgery

## 2019-12-03 DIAGNOSIS — M7989 Other specified soft tissue disorders: Secondary | ICD-10-CM

## 2019-12-03 DIAGNOSIS — M79669 Pain in unspecified lower leg: Secondary | ICD-10-CM

## 2019-12-03 DIAGNOSIS — I872 Venous insufficiency (chronic) (peripheral): Secondary | ICD-10-CM

## 2019-12-04 ENCOUNTER — Encounter (INDEPENDENT_AMBULATORY_CARE_PROVIDER_SITE_OTHER): Payer: Self-pay

## 2019-12-04 ENCOUNTER — Encounter (INDEPENDENT_AMBULATORY_CARE_PROVIDER_SITE_OTHER): Payer: Medicare HMO

## 2019-12-04 ENCOUNTER — Ambulatory Visit (INDEPENDENT_AMBULATORY_CARE_PROVIDER_SITE_OTHER): Payer: Medicare HMO | Admitting: Nurse Practitioner

## 2019-12-18 ENCOUNTER — Encounter (INDEPENDENT_AMBULATORY_CARE_PROVIDER_SITE_OTHER): Payer: Self-pay | Admitting: Nurse Practitioner

## 2019-12-18 ENCOUNTER — Other Ambulatory Visit: Payer: Self-pay

## 2019-12-18 ENCOUNTER — Ambulatory Visit (INDEPENDENT_AMBULATORY_CARE_PROVIDER_SITE_OTHER): Payer: Medicare HMO

## 2019-12-18 ENCOUNTER — Ambulatory Visit (INDEPENDENT_AMBULATORY_CARE_PROVIDER_SITE_OTHER): Payer: Medicare HMO | Admitting: Nurse Practitioner

## 2019-12-18 VITALS — BP 107/73 | HR 84 | Resp 16 | Wt 205.0 lb

## 2019-12-18 DIAGNOSIS — M79669 Pain in unspecified lower leg: Secondary | ICD-10-CM

## 2019-12-18 DIAGNOSIS — E782 Mixed hyperlipidemia: Secondary | ICD-10-CM | POA: Diagnosis not present

## 2019-12-18 DIAGNOSIS — I872 Venous insufficiency (chronic) (peripheral): Secondary | ICD-10-CM

## 2019-12-18 DIAGNOSIS — M7989 Other specified soft tissue disorders: Secondary | ICD-10-CM | POA: Diagnosis not present

## 2019-12-18 DIAGNOSIS — I8312 Varicose veins of left lower extremity with inflammation: Secondary | ICD-10-CM

## 2019-12-18 DIAGNOSIS — M79662 Pain in left lower leg: Secondary | ICD-10-CM

## 2019-12-30 ENCOUNTER — Encounter (INDEPENDENT_AMBULATORY_CARE_PROVIDER_SITE_OTHER): Payer: Self-pay

## 2019-12-30 ENCOUNTER — Encounter (INDEPENDENT_AMBULATORY_CARE_PROVIDER_SITE_OTHER): Payer: Self-pay | Admitting: Nurse Practitioner

## 2019-12-30 NOTE — Progress Notes (Signed)
Subjective:    Patient ID: Laurie [name], female    DOB: 09-Sep-1955, 64 y.o.   MRN: [id number] Chief Complaint  Patient presents with  . Follow-up    32month follow up    The patient returns for followup evaluation 3 months after the initial visit. The patient continues to have pain in the lower extremities with dependency. The pain is lessened with elevation. Graduated compression stockings, Class I (20-30 mmHg), have been worn but the stockings do not eliminate the leg pain. Over-the-counter analgesics do not improve the symptoms. The degree of discomfort continues to interfere with daily activities. The patient notes the pain in the legs is causing problems with daily exercise, at the workplace and even with household activities and maintenance such as standing in the kitchen preparing meals and doing dishes.   Venous ultrasound shows normal deep venous system, no evidence of acute or chronic DVT.  Superficial reflux is present in the left GSV at the [facility]   Review of Systems  Cardiovascular: Positive for leg swelling.  All other systems reviewed and are negative.      Objective:   Physical Exam Vitals reviewed.  Cardiovascular:     Rate and Rhythm: Normal rate.     Pulses: Normal pulses.  Pulmonary:     Effort: Pulmonary effort is normal.  Musculoskeletal:     Left lower leg: Edema present.  Skin:    General: Skin is warm and dry.  Neurological:     Mental Status: She is alert and oriented to person, place, and time.  Psychiatric:        Mood and Affect: Mood normal.        Behavior: Behavior normal.        Thought Content: Thought content normal.        Judgment: Judgment normal.     BP 107/73 (BP Location: Left Arm)   Pulse 84   Resp 16   Wt 205 lb (93 kg)   BMI 35.19 kg/m   Past Medical History:  Diagnosis Date  . Depression   . Diabetes mellitus    diet controlled  . Hyperlipidemia   . Hypertension   . Panic attack   . Restless leg syndrome   .  Treadmill stress test negative for angina pectoris 2009    Social History   Socioeconomic History  . Marital status: Married    Spouse name: Not on file  . Number of children: Not on file  . Years of education: Not on file  . Highest education level: Not on file  Occupational History  . Not on file  Tobacco Use  . Smoking status: Never Smoker  . Smokeless tobacco: Never Used  Substance and Sexual Activity  . Alcohol use: No  . Drug use: Not on file  . Sexual activity: Not on file  Other Topics Concern  . Not on file  Social History Narrative  . Not on file   Social Determinants of Health   Financial Resource Strain:   . Difficulty of Paying Living Expenses: Not on file  Food Insecurity:   . Worried About Programme researcher, broadcasting/film/video in the Last Year: Not on file  . Ran Out of Food in the Last Year: Not on file  Transportation Needs:   . Lack of Transportation (Medical): Not on file  . Lack of Transportation (Non-Medical): Not on file  Physical Activity:   . Days of Exercise per Week: Not on file  . Minutes  of Exercise per Session: Not on file  Stress:   . Feeling of Stress : Not on file  Social Connections:   . Frequency of Communication with Friends and Family: Not on file  . Frequency of Social Gatherings with Friends and Family: Not on file  . Attends Religious Services: Not on file  . Active Member of Clubs or Organizations: Not on file  . Attends Banker Meetings: Not on file  . Marital Status: Not on file  Intimate Partner Violence:   . Fear of Current or Ex-Partner: Not on file  . Emotionally Abused: Not on file  . Physically Abused: Not on file  . Sexually Abused: Not on file    Past Surgical History:  Procedure Laterality Date  . ABDOMINAL HYSTERECTOMY  1984  . BREAST LUMPECTOMY  2009   left breast  . HAND SURGERY     right hand  . KNEE SURGERY     left knee  . TONSILLECTOMY      Family History  Problem Relation Age of Onset  .  Hypertension Mother   . COPD Mother   . Heart attack Father   . Kidney cancer Father   . Heart attack Brother   . Hypertension Brother   . Lymphoma Brother   . COPD Brother   . Hypertension Brother     Allergies  Allergen Reactions  . Ivp Dye [Iodinated Diagnostic Agents]   . Latex   . Sulfa Drugs Cross Reactors   . Sulfa Antibiotics Rash    CBC Latest Ref Rng & Units 10/21/2011 10/20/2011 10/19/2011  WBC 3.6 - 11.0 x10 3/mm 3 20.5(H) - 9.6  Hemoglobin 12.0 - 16.0 g/dL 23.3 - 61.2  Hematocrit 35.0 - 47.0 % 40.5 - 38.6  Platelets 150 - 440 x10 3/mm 3 200 141(L) 149(L)      CMP     Component Value Date/Time   NA 139 10/19/2011 0442   K 3.5 10/19/2011 0442   CL 105 10/19/2011 0442   CO2 24 10/19/2011 0442   GLUCOSE 114 (H) 10/19/2011 0442   BUN 7 10/19/2011 0442   CREATININE 0.70 01/07/2018 1540   CREATININE 0.71 10/19/2011 0442   CALCIUM 8.6 10/19/2011 0442   PROT 7.4 10/21/2011 0510   ALBUMIN 3.4 10/21/2011 0510   AST 155 (H) 10/21/2011 0510   ALT 336 (H) 10/21/2011 0510   ALKPHOS 214 (H) 10/21/2011 0510   BILITOT 1.1 (H) 10/21/2011 0510   GFRNONAA >60 10/19/2011 0442   GFRAA >60 10/19/2011 0442         Assessment & Plan:   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 Patient will continue  wearing graduated compression stockings class 1 on a daily basis, beginning first thing in the morning and removing them in the evening.    In addition, behavioral modification including elevation during the day was again discussed and this will continue.  The patient has utilized over the counter pain medications and has been exercising.  However, at this time conservative therapy has not alleviated the patient's symptoms of leg pain and swelling  Recommend: laser ablation of the   left great saphenous veins to eliminate the  symptoms of pain and swelling of the lower extremities caused by the severe superficial venous reflux disease.   2. Mixed hyperlipidemia Continue statin as ordered and reviewed, no changes  at this time    Current Outpatient Medications on File Prior to Visit  Medication Sig Dispense Refill  . ALPRAZolam (XANAX) 1 MG tablet Take 1 mg by mouth.    Marland Kitchen aspirin 81 MG tablet Take 81 mg by mouth daily.    Marland Kitchen ezetimibe (ZETIA) 10 MG tablet Take 10 mg by mouth daily.  2  . furosemide (LASIX) 20 MG tablet Take 20 mg by mouth.    . gabapentin (NEURONTIN) 300 MG capsule TAKE ONE CAPSULE BY MOUTH EVERY 8 HOURS  5  . glucose blood (FREESTYLE TEST STRIPS) test strip by Other route Two (2) times a day.    . morphine (MSIR) 30 MG tablet Take 30 mg by mouth in the morning and at bedtime.    . sertraline (ZOLOFT) 100 MG tablet Take 100 mg by mouth daily.  1  . levothyroxine (SYNTHROID, LEVOTHROID) 50 MCG tablet TAKE 1 TABLET (50 MCG TOTAL) BY MOUTH DAILY AT 0600.  3  . morphine (MS CONTIN) 15 MG 12 hr tablet Take 15 mg by mouth every 8 (eight) hours as needed.  0  . morphine (MSIR) 15 MG tablet Take 15 mg by mouth 3 (three) times daily.    . SERTRALINE HCL PO Take 300 mg by mouth daily. (Patient not taking: Reported on 09/02/2019)     No current facility-administered medications on file prior to visit.    There are no Patient Instructions on file for this visit. No follow-ups on file.   Georgiana Spinner, NP

## 2020-01-01 ENCOUNTER — Encounter (INDEPENDENT_AMBULATORY_CARE_PROVIDER_SITE_OTHER): Payer: Self-pay

## 2020-02-28 ENCOUNTER — Other Ambulatory Visit: Payer: Medicare HMO

## 2020-03-03 ENCOUNTER — Encounter (INDEPENDENT_AMBULATORY_CARE_PROVIDER_SITE_OTHER): Payer: Self-pay

## 2020-03-05 ENCOUNTER — Other Ambulatory Visit: Payer: Medicare HMO

## 2020-03-09 ENCOUNTER — Inpatient Hospital Stay: Admit: 2020-03-09 | Payer: Medicare HMO | Admitting: Orthopedic Surgery

## 2020-03-09 SURGERY — ARTHROPLASTY, KNEE, TOTAL, USING IMAGELESS COMPUTER-ASSISTED NAVIGATION
Anesthesia: Choice | Site: Knee | Laterality: Left

## 2020-03-25 ENCOUNTER — Telehealth (INDEPENDENT_AMBULATORY_CARE_PROVIDER_SITE_OTHER): Payer: Self-pay | Admitting: Nurse Practitioner

## 2020-03-25 NOTE — Telephone Encounter (Signed)
Patient was informed that once authorization is received someone will contact her to schedule for the laser

## 2020-05-08 ENCOUNTER — Telehealth (INDEPENDENT_AMBULATORY_CARE_PROVIDER_SITE_OTHER): Payer: Self-pay | Admitting: Vascular Surgery

## 2020-05-08 NOTE — Telephone Encounter (Signed)
Pt called wanting to know if we are able to schedule her laser ablation appt for L GSV. Looks like the insurance has not gotten back with [place] yet. I advised that I will be calling them and trying to get an answer from them. Pt states that she is "getting really bad". States that she has fallen twice and it is painful. "Having a hard time". I will follow up with insurance and call pt back.

## 2020-05-12 ENCOUNTER — Telehealth (INDEPENDENT_AMBULATORY_CARE_PROVIDER_SITE_OTHER): Payer: Self-pay | Admitting: Vascular Surgery

## 2020-05-12 NOTE — Telephone Encounter (Signed)
For documentation only

## 2020-05-15 ENCOUNTER — Telehealth (INDEPENDENT_AMBULATORY_CARE_PROVIDER_SITE_OTHER): Payer: Self-pay | Admitting: Vascular Surgery

## 2020-05-15 ENCOUNTER — Encounter (INDEPENDENT_AMBULATORY_CARE_PROVIDER_SITE_OTHER): Payer: Self-pay

## 2020-05-20 NOTE — Telephone Encounter (Signed)
Spoke with pt and scheduled her appt - nothing further needed at this time.

## 2020-05-21 ENCOUNTER — Other Ambulatory Visit (INDEPENDENT_AMBULATORY_CARE_PROVIDER_SITE_OTHER): Payer: Medicare HMO | Admitting: Vascular Surgery

## 2020-05-28 ENCOUNTER — Other Ambulatory Visit: Payer: Self-pay

## 2020-05-28 ENCOUNTER — Ambulatory Visit (INDEPENDENT_AMBULATORY_CARE_PROVIDER_SITE_OTHER): Payer: Medicare HMO | Admitting: Vascular Surgery

## 2020-05-28 ENCOUNTER — Encounter (INDEPENDENT_AMBULATORY_CARE_PROVIDER_SITE_OTHER): Payer: Self-pay | Admitting: Vascular Surgery

## 2020-05-28 DIAGNOSIS — I83819 Varicose veins of unspecified lower extremities with pain: Secondary | ICD-10-CM | POA: Insufficient documentation

## 2020-05-28 DIAGNOSIS — I83812 Varicose veins of left lower extremities with pain: Secondary | ICD-10-CM | POA: Diagnosis not present

## 2020-05-28 NOTE — Progress Notes (Signed)
    MRN : [id number]  Laurie [name] is a 65 y.o. (1955-10-22) female who presents with chief complaint of No chief complaint on file. .    The patient's left lower extremity was sterilely prepped and draped.  The ultrasound machine was used to visualize the left great saphenous vein throughout its course.  A segment in the mid thigh was selected for access.  The saphenous vein was accessed without difficulty using ultrasound guidance with a micropuncture needle.   An 0.018  wire was placed beyond the saphenofemoral junction through the sheath and the microneedle was removed.  The 65 cm sheath was then placed over the wire and the wire and dilator were removed.  The laser fiber was placed through the sheath and its tip was placed approximately 2 cm below the saphenofemoral junction.  Tumescent anesthesia was then created with a dilute lidocaine solution.  Laser energy was then delivered with constant withdrawal of the sheath and laser fiber.  Approximately 982 Joules of energy were delivered over a length of 19 cm.  Sterile dressings were placed.  The patient tolerated the procedure well without complications.

## 2020-05-29 ENCOUNTER — Other Ambulatory Visit (INDEPENDENT_AMBULATORY_CARE_PROVIDER_SITE_OTHER): Payer: Self-pay | Admitting: Vascular Surgery

## 2020-05-29 DIAGNOSIS — I83819 Varicose veins of unspecified lower extremities with pain: Secondary | ICD-10-CM

## 2020-06-01 ENCOUNTER — Ambulatory Visit (INDEPENDENT_AMBULATORY_CARE_PROVIDER_SITE_OTHER): Payer: Medicare HMO

## 2020-06-01 ENCOUNTER — Other Ambulatory Visit: Payer: Self-pay

## 2020-06-01 DIAGNOSIS — I83819 Varicose veins of unspecified lower extremities with pain: Secondary | ICD-10-CM

## 2020-06-01 DIAGNOSIS — I83812 Varicose veins of left lower extremities with pain: Secondary | ICD-10-CM | POA: Diagnosis not present

## 2020-06-07 ENCOUNTER — Other Ambulatory Visit (INDEPENDENT_AMBULATORY_CARE_PROVIDER_SITE_OTHER): Payer: Self-pay | Admitting: Vascular Surgery

## 2020-06-29 ENCOUNTER — Ambulatory Visit (INDEPENDENT_AMBULATORY_CARE_PROVIDER_SITE_OTHER): Payer: Medicare HMO | Admitting: Vascular Surgery

## 2020-07-06 ENCOUNTER — Ambulatory Visit (INDEPENDENT_AMBULATORY_CARE_PROVIDER_SITE_OTHER): Payer: Medicare HMO | Admitting: Vascular Surgery

## 2020-07-13 ENCOUNTER — Other Ambulatory Visit: Payer: Self-pay

## 2020-07-13 ENCOUNTER — Ambulatory Visit (INDEPENDENT_AMBULATORY_CARE_PROVIDER_SITE_OTHER): Payer: Medicare HMO | Admitting: Vascular Surgery

## 2020-07-13 ENCOUNTER — Encounter (INDEPENDENT_AMBULATORY_CARE_PROVIDER_SITE_OTHER): Payer: Self-pay | Admitting: Vascular Surgery

## 2020-07-13 VITALS — BP 128/80 | HR 84 | Resp 16 | Wt 204.0 lb

## 2020-07-13 DIAGNOSIS — M8949 Other hypertrophic osteoarthropathy, multiple sites: Secondary | ICD-10-CM | POA: Diagnosis not present

## 2020-07-13 DIAGNOSIS — M159 Polyosteoarthritis, unspecified: Secondary | ICD-10-CM

## 2020-07-13 DIAGNOSIS — I872 Venous insufficiency (chronic) (peripheral): Secondary | ICD-10-CM

## 2020-07-13 DIAGNOSIS — E782 Mixed hyperlipidemia: Secondary | ICD-10-CM | POA: Diagnosis not present

## 2020-07-13 DIAGNOSIS — I83819 Varicose veins of unspecified lower extremities with pain: Secondary | ICD-10-CM

## 2020-07-13 NOTE — Progress Notes (Signed)
MRN : [id number]  Laurie [name] is a 65 y.o. (1955/11/02) female who presents with chief complaint of  Chief Complaint  Patient presents with  . Follow-up    4 week post laser   .  History of Present Illness:   The patient returns to the office for followup status post laser ablation of the left great saphenous vein on 05/28/2020.  The patient note significant improvement in the lower extremity pain but not resolution of the symptoms. The patient notes multiple residual varicosities bilaterally which continued to hurt with dependent positions and remained tender to palpation. The patient's swelling is minimally from preoperative status. The patient continues to wear graduated compression stockings on a daily basis but these are not eliminating the pain and discomfort. The patient continues to use over-the-counter anti-inflammatory medications to treat the pain and related symptoms but this has not given the patient relief. The patient notes the pain in the lower extremities is causing problems with daily exercise, problems at work and even with household activities such as preparing meals and doing dishes.  The patient is otherwise done well and there have been no complications related to the laser procedure or interval changes in the patient's overall   Post laser ultrasound shows successful ablation of the left great saphenous vein   Current Meds  Medication Sig  . ALPRAZolam (XANAX) 1 MG tablet Take 1 mg by mouth.  Marland Kitchen aspirin 81 MG tablet Take 81 mg by mouth daily.  Marland Kitchen ezetimibe (ZETIA) 10 MG tablet Take 10 mg by mouth daily.  . furosemide (LASIX) 20 MG tablet Take 20 mg by mouth.  . gabapentin (NEURONTIN) 300 MG capsule TAKE ONE CAPSULE BY MOUTH EVERY 8 HOURS  . glucose blood test strip by Other route Two (2) times a day.  . morphine (MSIR) 30 MG tablet Take 30 mg by mouth in the morning and at bedtime.  . sertraline (ZOLOFT) 100 MG tablet Take 100 mg by mouth daily.    Past  Medical History:  Diagnosis Date  . Depression   . Diabetes mellitus    diet controlled  . Hyperlipidemia   . Hypertension   . Panic attack   . Restless leg syndrome   . Treadmill stress test negative for angina pectoris 2009    Past Surgical History:  Procedure Laterality Date  . ABDOMINAL HYSTERECTOMY  1984  . BREAST LUMPECTOMY  2009   left breast  . HAND SURGERY     right hand  . KNEE SURGERY     left knee  . TONSILLECTOMY      Social History Social History   Tobacco Use  . Smoking status: Never Smoker  . Smokeless tobacco: Never Used  Substance Use Topics  . Alcohol use: No    Family History Family History  Problem Relation Age of Onset  . Hypertension Mother   . COPD Mother   . Heart attack Father   . Kidney cancer Father   . Heart attack Brother   . Hypertension Brother   . Lymphoma Brother   . COPD Brother   . Hypertension Brother     Allergies  Allergen Reactions  . Ivp Dye [Iodinated Diagnostic Agents]   . Latex     Other reaction(s): Unknown  . Sulfa Drugs Cross Reactors   . Sulfa Antibiotics Rash     REVIEW OF SYSTEMS (Negative unless checked)  Constitutional: [] Weight loss  [] Fever  [] Chills Cardiac: [] Chest pain   [] Chest pressure   []   Palpitations   [] Shortness of breath when laying flat   [] Shortness of breath with exertion. Vascular:  [] Pain in legs with walking   [x] Pain in legs at rest  [] History of DVT   [] Phlebitis   [x] Swelling in legs   [x] Varicose veins   [] Non-healing ulcers Pulmonary:   [] Uses home oxygen   [] Productive cough   [] Hemoptysis   [] Wheeze  [] COPD   [] Asthma Neurologic:  [] Dizziness   [] Seizures   [] History of stroke   [] History of TIA  [] Aphasia   [] Vissual changes   [] Weakness or numbness in arm   [] Weakness or numbness in leg Musculoskeletal:   [] Joint swelling   [] Joint pain   [] Low back pain Hematologic:  [] Easy bruising  [] Easy bleeding   [] Hypercoagulable state   [] Anemic Gastrointestinal:  [] Diarrhea    [] Vomiting  [] Gastroesophageal reflux/heartburn   [] Difficulty swallowing. Genitourinary:  [] Chronic kidney disease   [] Difficult urination  [] Frequent urination   [] Blood in urine Skin:  [] Rashes   [] Ulcers  Psychological:  [] History of anxiety   []  History of major depression.  Physical Examination  Vitals:   07/13/20 1512  BP: 128/80  Pulse: 84  Resp: 16  Weight: 204 lb (92.5 kg)   Body mass index is 35.02 kg/m. Gen: WD/WN, NAD Head: [place]/AT, No temporalis wasting.  Ear/Nose/Throat: Hearing grossly intact, nares w/o erythema or drainage Eyes: PER, EOMI, sclera nonicteric.  Neck: Supple, no large masses.   Pulmonary:  Good air movement, no audible wheezing bilaterally, no use of accessory muscles.  Cardiac: RRR, no JVD Vascular: Large varicosities present extensively greater than 10 mm left.  Mild venous stasis changes to the legs bilaterally.  2+ soft pitting edema Vessel Right Left  Radial Palpable Palpable  PT Palpable Palpable  DP Palpable Palpable  Gastrointestinal: Non-distended. No guarding/no peritoneal signs.  Musculoskeletal: M/S 5/5 throughout.  No deformity or atrophy.  Neurologic: CN 2-12 intact. Symmetrical.  Speech is fluent. Motor exam as listed above. Psychiatric: Judgment intact, Mood & affect appropriate for pt's clinical situation. Dermatologic: Venous rashes no ulcers noted.  No changes consistent with cellulitis.   CBC Lab Results  Component Value Date   WBC 20.5 (H) 10/21/2011   HGB 14.2 10/21/2011   HCT 40.5 10/21/2011   MCV 91 10/21/2011   PLT 200 10/21/2011    BMET    Component Value Date/Time   NA 139 10/19/2011 0442   K 3.5 10/19/2011 0442   CL 105 10/19/2011 0442   CO2 24 10/19/2011 0442   GLUCOSE 114 (H) 10/19/2011 0442   BUN 7 10/19/2011 0442   CREATININE 0.70 01/07/2018 1540   CREATININE 0.71 10/19/2011 0442   CALCIUM 8.6 10/19/2011 0442   GFRNONAA >60 10/19/2011 0442   GFRAA >60 10/19/2011 0442   CrCl cannot be calculated  (Patient's most recent lab result is older than the maximum 21 days allowed.).  COAG No results found for: INR, PROTIME  Radiology No results found.    Assessment/Plan 1. Varicose veins with pain Recommend:  The patient has had successful ablation of the previously incompetent saphenous venous system but still has persistent symptoms of pain and swelling that are having a negative impact on daily life and daily activities.  Patient should undergo injection sclerotherapy to treat the residual varicosities.  The risks, benefits and alternative therapies were reviewed in detail with the patient.  All questions were answered.  The patient agrees to proceed with sclerotherapy at their convenience.  The patient will continue wearing the graduated  compression stockings and using the over-the-counter pain medications to treat her symptoms.       2. Chronic venous insufficiency Recommend:  The patient has had successful ablation of the previously incompetent saphenous venous system but still has persistent symptoms of pain and swelling that are having a negative impact on daily life and daily activities.  Patient should undergo injection sclerotherapy to treat the residual varicosities.  The risks, benefits and alternative therapies were reviewed in detail with the patient.  All questions were answered.  The patient agrees to proceed with sclerotherapy at their convenience.  The patient will continue wearing the graduated compression stockings and using the over-the-counter pain medications to treat her symptoms.       3. Mixed hyperlipidemia Continue statin as ordered and reviewed, no changes at this time   4. Primary osteoarthritis involving multiple joints Continue NSAID medications as already ordered, these medications have been reviewed and there are no changes at this time.  Continued activity and therapy was stressed.     Levora Dredge, MD  07/13/2020 3:31 PM

## 2020-07-14 ENCOUNTER — Encounter (INDEPENDENT_AMBULATORY_CARE_PROVIDER_SITE_OTHER): Payer: Self-pay | Admitting: Vascular Surgery

## 2020-09-22 ENCOUNTER — Ambulatory Visit: Payer: Medicare HMO | Admitting: Podiatry

## 2020-10-05 ENCOUNTER — Telehealth (INDEPENDENT_AMBULATORY_CARE_PROVIDER_SITE_OTHER): Payer: Self-pay

## 2020-10-05 NOTE — Telephone Encounter (Signed)
Patient left a voicemail stating that her left leg is doing the same thing as last time. Patient would like to know the provider next steps. I left a voicemail for the patient to return a call.

## 2020-10-05 NOTE — Telephone Encounter (Signed)
Patient informed that her left leg swelling and vein was popping out. Patient was asked if she has been elevating and wearing compression? The patient verbalized that she has done both.The patient had laser done in [place] and plans on moving forward with sclerotherapy.

## 2020-10-05 NOTE — Telephone Encounter (Signed)
Patient returning phone call left from nurse.  I did let patient know to keep her phone close as the nurse will be in touch asap.

## 2020-11-02 ENCOUNTER — Telehealth (INDEPENDENT_AMBULATORY_CARE_PROVIDER_SITE_OTHER): Payer: Self-pay | Admitting: Vascular Surgery

## 2020-11-02 NOTE — Telephone Encounter (Signed)
I called the pt to make her aware that her Rx needed to be refilled by PCP she said she never requested.

## 2020-11-02 NOTE — Telephone Encounter (Signed)
The patient only received a one time dose of alprazolam for her laser procedure.  There will be no refills.  She needs to see her PCP for a statin

## 2020-11-02 NOTE — Telephone Encounter (Signed)
Please advise 

## 2021-02-08 ENCOUNTER — Telehealth (INDEPENDENT_AMBULATORY_CARE_PROVIDER_SITE_OTHER): Payer: Self-pay

## 2021-02-08 NOTE — Telephone Encounter (Signed)
Patient called in stating that she had a procedure a while back and is having some major swelling and can barley walk. She is wanting to speak with someone asap     Please call and advise

## 2021-02-08 NOTE — Telephone Encounter (Signed)
Left a message on patient voicemail to return call back to the office 

## 2021-05-19 ENCOUNTER — Encounter (INDEPENDENT_AMBULATORY_CARE_PROVIDER_SITE_OTHER): Payer: Self-pay | Admitting: Vascular Surgery

## 2021-05-20 NOTE — Telephone Encounter (Signed)
It looks like we did her ablation in 05/2020.  It's possible it is a different vein. She will need to come in for a venous reflux study in a couple of weeks to evaluate what may be happening.

## 2021-06-09 ENCOUNTER — Other Ambulatory Visit (INDEPENDENT_AMBULATORY_CARE_PROVIDER_SITE_OTHER): Payer: Self-pay | Admitting: Nurse Practitioner

## 2021-06-09 DIAGNOSIS — I872 Venous insufficiency (chronic) (peripheral): Secondary | ICD-10-CM

## 2021-06-09 DIAGNOSIS — I83819 Varicose veins of unspecified lower extremities with pain: Secondary | ICD-10-CM

## 2021-06-10 ENCOUNTER — Encounter (INDEPENDENT_AMBULATORY_CARE_PROVIDER_SITE_OTHER): Payer: Medicare PPO

## 2021-06-10 ENCOUNTER — Ambulatory Visit (INDEPENDENT_AMBULATORY_CARE_PROVIDER_SITE_OTHER): Payer: Medicare PPO | Admitting: Nurse Practitioner

## 2021-06-22 ENCOUNTER — Other Ambulatory Visit: Payer: Self-pay | Admitting: Family Medicine

## 2021-06-22 DIAGNOSIS — Z1231 Encounter for screening mammogram for malignant neoplasm of breast: Secondary | ICD-10-CM

## 2021-07-12 ENCOUNTER — Encounter (INDEPENDENT_AMBULATORY_CARE_PROVIDER_SITE_OTHER): Payer: Self-pay

## 2021-07-12 ENCOUNTER — Ambulatory Visit (INDEPENDENT_AMBULATORY_CARE_PROVIDER_SITE_OTHER): Payer: Medicare HMO | Admitting: Nurse Practitioner

## 2021-07-12 ENCOUNTER — Encounter (INDEPENDENT_AMBULATORY_CARE_PROVIDER_SITE_OTHER): Payer: Medicare HMO

## 2021-07-21 ENCOUNTER — Ambulatory Visit
Admission: RE | Admit: 2021-07-21 | Discharge: 2021-07-21 | Disposition: A | Discharge status: Home / Self Care | Payer: Medicare HMO | Source: Ambulatory Visit | Attending: Family Medicine | Admitting: Family Medicine

## 2021-07-21 DIAGNOSIS — Z1231 Encounter for screening mammogram for malignant neoplasm of breast: Secondary | ICD-10-CM | POA: Insufficient documentation

## 2021-07-23 ENCOUNTER — Inpatient Hospital Stay
Admission: RE | Admit: 2021-07-23 | Discharge: 2021-07-23 | Disposition: A | Discharge status: Home / Self Care | Payer: Self-pay | Source: Ambulatory Visit | Attending: *Deleted | Admitting: *Deleted

## 2021-07-23 ENCOUNTER — Other Ambulatory Visit: Payer: Self-pay | Admitting: *Deleted

## 2021-07-23 DIAGNOSIS — Z1231 Encounter for screening mammogram for malignant neoplasm of breast: Secondary | ICD-10-CM

## 2021-07-26 ENCOUNTER — Other Ambulatory Visit: Payer: Self-pay | Admitting: *Deleted

## 2021-07-26 ENCOUNTER — Inpatient Hospital Stay
Admission: RE | Admit: 2021-07-26 | Discharge: 2021-07-26 | Disposition: A | Discharge status: Home / Self Care | Payer: Self-pay | Source: Ambulatory Visit | Attending: *Deleted | Admitting: *Deleted

## 2021-07-26 DIAGNOSIS — Z1231 Encounter for screening mammogram for malignant neoplasm of breast: Secondary | ICD-10-CM

## 2021-09-06 ENCOUNTER — Telehealth (INDEPENDENT_AMBULATORY_CARE_PROVIDER_SITE_OTHER): Payer: Self-pay | Admitting: Nurse Practitioner

## 2021-09-06 NOTE — Telephone Encounter (Signed)
Left leg vein swollen, painful, blue looking, pulsating pain running all the way up to groin. Same leg that laser was performed on. Patient states that it has been going on for 6 months. Please advise.

## 2021-09-06 NOTE — Telephone Encounter (Signed)
Patient can be schedule for reflux study follow up with Dr Gilda Crease or [facility] [place] per Dr Gilda Crease

## 2021-09-07 NOTE — Telephone Encounter (Signed)
Spoke with husband as pt was not in at the moment. I asked to leave a message for pt to call back to [facility].  Le reflux + consult. See gs/fb. Left leg vein swollen, painful, blue looking, pulsating pain running all the way up to groin. Same leg that laser was performed on. Patient states that it has been going on for 6 months.

## 2021-10-05 ENCOUNTER — Telehealth (INDEPENDENT_AMBULATORY_CARE_PROVIDER_SITE_OTHER): Payer: Self-pay | Admitting: Nurse Practitioner

## 2021-10-05 NOTE — Telephone Encounter (Signed)
Pt LVMOM wanting to know time of appt on Wednesday - ATC - primary line Vm full. LM on home phone advising of time and date of appt. Nothing further needed at this time.

## 2021-10-06 ENCOUNTER — Ambulatory Visit (INDEPENDENT_AMBULATORY_CARE_PROVIDER_SITE_OTHER): Payer: Medicare HMO | Admitting: Nurse Practitioner

## 2021-10-06 ENCOUNTER — Encounter (INDEPENDENT_AMBULATORY_CARE_PROVIDER_SITE_OTHER): Payer: Medicare HMO

## 2021-12-20 ENCOUNTER — Encounter (INDEPENDENT_AMBULATORY_CARE_PROVIDER_SITE_OTHER): Payer: Self-pay

## 2021-12-31 ENCOUNTER — Telehealth (INDEPENDENT_AMBULATORY_CARE_PROVIDER_SITE_OTHER): Payer: Self-pay

## 2022-01-27 ENCOUNTER — Telehealth (INDEPENDENT_AMBULATORY_CARE_PROVIDER_SITE_OTHER): Payer: Self-pay | Admitting: Nurse Practitioner

## 2022-01-27 NOTE — Telephone Encounter (Signed)
Pt LVMOM yesterday 12.6.23 - I returned call and LVM advising of [place] and F/U appt tomorrow 12.8.23. I also advised that it was probably the automated system that called her and to check messages.

## 2022-01-28 ENCOUNTER — Encounter (INDEPENDENT_AMBULATORY_CARE_PROVIDER_SITE_OTHER): Payer: Medicare HMO

## 2022-01-28 ENCOUNTER — Ambulatory Visit (INDEPENDENT_AMBULATORY_CARE_PROVIDER_SITE_OTHER): Payer: Medicare HMO | Admitting: Nurse Practitioner

## 2022-03-02 ENCOUNTER — Encounter (INDEPENDENT_AMBULATORY_CARE_PROVIDER_SITE_OTHER): Payer: Self-pay

## 2022-03-02 ENCOUNTER — Encounter (INDEPENDENT_AMBULATORY_CARE_PROVIDER_SITE_OTHER): Payer: Medicare HMO

## 2022-03-02 ENCOUNTER — Ambulatory Visit (INDEPENDENT_AMBULATORY_CARE_PROVIDER_SITE_OTHER): Payer: Medicare HMO | Admitting: Nurse Practitioner

## 2022-04-08 ENCOUNTER — Ambulatory Visit (INDEPENDENT_AMBULATORY_CARE_PROVIDER_SITE_OTHER): Payer: Medicare HMO | Admitting: Nurse Practitioner

## 2022-04-08 ENCOUNTER — Encounter (INDEPENDENT_AMBULATORY_CARE_PROVIDER_SITE_OTHER): Payer: Self-pay

## 2022-04-08 ENCOUNTER — Encounter (INDEPENDENT_AMBULATORY_CARE_PROVIDER_SITE_OTHER): Payer: Medicare HMO

## 2022-08-05 ENCOUNTER — Telehealth: Payer: Self-pay | Admitting: Family Medicine

## 2022-08-05 NOTE — Telephone Encounter (Signed)
Pt called and stated that Laurie [name] was supposed to send in a referral. I mentioned to her that at this moment we do not have a referral. Waiting to see if one comes via fax.

## 2022-11-17 ENCOUNTER — Other Ambulatory Visit: Payer: Self-pay | Admitting: Specialist

## 2022-11-17 ENCOUNTER — Encounter
Admission: RE | Admit: 2022-11-17 | Discharge: 2022-11-17 | Disposition: A | Discharge status: Home / Self Care | Payer: Medicare PPO | Source: Ambulatory Visit | Attending: Specialist | Admitting: Specialist

## 2022-11-17 DIAGNOSIS — Z01818 Encounter for other preprocedural examination: Secondary | ICD-10-CM

## 2022-11-17 DIAGNOSIS — I10 Essential (primary) hypertension: Secondary | ICD-10-CM

## 2022-11-17 DIAGNOSIS — Z01810 Encounter for preprocedural cardiovascular examination: Secondary | ICD-10-CM

## 2022-11-17 DIAGNOSIS — Z01812 Encounter for preprocedural laboratory examination: Secondary | ICD-10-CM

## 2022-11-17 DIAGNOSIS — Z79899 Other long term (current) drug therapy: Secondary | ICD-10-CM

## 2022-11-17 DIAGNOSIS — E119 Type 2 diabetes mellitus without complications: Secondary | ICD-10-CM

## 2022-11-17 HISTORY — DX: Hereditary hemochromatosis: E83.110

## 2022-11-17 HISTORY — DX: Other chronic pain: G89.29

## 2022-11-17 HISTORY — DX: Personal history of urinary calculi: Z87.442

## 2022-11-17 HISTORY — DX: Carpal tunnel syndrome, unspecified upper limb: G56.00

## 2022-11-17 HISTORY — DX: Unspecified asthma, uncomplicated: J45.909

## 2022-11-17 HISTORY — DX: Post-traumatic stress disorder, unspecified: F43.10

## 2022-11-17 HISTORY — DX: Splenomegaly, not elsewhere classified: R16.1

## 2022-11-17 HISTORY — DX: Burn of second degree of buttock, initial encounter: T21.25XA

## 2022-11-17 HISTORY — DX: Venous insufficiency (chronic) (peripheral): I87.2

## 2022-11-17 HISTORY — DX: Unspecified osteoarthritis, unspecified site: M19.90

## 2022-11-17 HISTORY — DX: Obsessive-compulsive disorder, unspecified: F42.9

## 2022-11-17 HISTORY — DX: Type 2 diabetes mellitus without complications: E11.9

## 2022-11-17 NOTE — Patient Instructions (Signed)
Your procedure is scheduled on:11-24-22 Thursday Report to the Registration Desk on the 1st floor of the [facility].Then proceed to the 2nd floor Surgery Desk To find out your arrival time, please call [phone] between 1PM - 3PM on:11-23-22 Wednesday If your arrival time is 6:00 am, do not arrive before that time as the [facility] entrance doors do not open until 6:00 am.  REMEMBER: Instructions that are not followed completely may result in serious medical risk, up to and including death; or upon the discretion of your surgeon and anesthesiologist your surgery may need to be rescheduled.  Do not eat food OR drink any liquids after midnight the night before surgery.  No gum chewing or hard candies.  One week prior to surgery: Stop Anti-inflammatories (NSAIDS) such as Advil, Aleve, Ibuprofen, Motrin, Naproxen, Naprosyn and Aspirin based products such as Excedrin, Goody's Powder, BC Powder. Stop ANY OVER THE COUNTER supplements until after surgery. You may however, continue to take Tylenol if needed for pain up until the day of surgery (Vitamin B12,Magnesium, Preservision AREDS, Fish Oil)  Continue taking all prescribed medications with the exception of the following: -Aspirin-Stop NOW (11-17-22) -metFORMIN (GLUCOPHAGE-XR)-Stop 2 days prior to surgery-Last dose will be on 11-21-22 Monday  TAKE ONLY THESE MEDICATIONS THE MORNING OF SURGERY WITH A SIP OF WATER: -ALPRAZolam (XANAX)  -levothyroxine (SYNTHROID)  -morphine (MSIR)  -sertraline (ZOLOFT) -pantoprazole (PROTONIX)-take one the night before and one on the morning of surgery - helps to prevent nausea after surgery.)  No Alcohol for 24 hours before or after surgery.  No Smoking including e-cigarettes for 24 hours before surgery.  No chewable tobacco products for at least 6 hours before surgery.  No nicotine patches on the day of surgery.  Do not use any "recreational" drugs for at least a week (preferably 2 weeks) before your  surgery.  Please be advised that the combination of cocaine and anesthesia may have negative outcomes, up to and including death. If you test positive for cocaine, your surgery will be cancelled.  On the morning of surgery brush your teeth with toothpaste and water, you may rinse your mouth with mouthwash if you wish. Do not swallow any toothpaste or mouthwash.  Use CHG Soap as directed on instruction sheet.  Do not wear jewelry, make-up, hairpins, clips or nail polish.  For welded (permanent) jewelry: bracelets, anklets, waist bands, etc.  Please have this removed prior to surgery.  If it is not removed, there is a chance that hospital personnel will need to cut it off on the day of surgery.  Do not wear lotions, powders, or perfumes.   Do not shave body hair from the neck down 48 hours before surgery.  Contact lenses, hearing aids and dentures may not be worn into surgery.  Do not bring valuables to the hospital. [facility] is not responsible for any missing/lost belongings or valuables.   Notify your doctor if there is any change in your medical condition (cold, fever, infection).  Wear comfortable clothing (specific to your surgery type) to the hospital.  After surgery, you can help prevent lung complications by doing breathing exercises.  Take deep breaths and cough every 1-2 hours. Your doctor may order a device called an Incentive Spirometer to help you take deep breaths. When coughing or sneezing, hold a pillow firmly against your incision with both hands. This is called "splinting." Doing this helps protect your incision. It also decreases belly discomfort.  If you are being admitted to the hospital  overnight, leave your suitcase in the car. After surgery it may be brought to your room.  In case of increased patient census, it may be necessary for you, the patient, to continue your postoperative care in the Same Day Surgery department.  If you are being discharged the day  of surgery, you will not be allowed to drive home. You will need a responsible individual to drive you home and stay with you for 24 hours after surgery.   If you are taking public transportation, you will need to have a responsible individual with you.  Please call the Pre-admissions Testing Dept. at [phone] if you have any questions about these instructions.  Surgery Visitation Policy:  Patients having surgery or a procedure may have two visitors.  Children under the age of 36 must have an adult with them who is not the patient.     Preparing for Surgery with CHLORHEXIDINE GLUCONATE (CHG) Soap  Chlorhexidine Gluconate (CHG) Soap  o An antiseptic cleaner that kills germs and bonds with the skin to continue killing germs even after washing  o Used for showering the night before surgery and morning of surgery  Before surgery, you can play an important role by reducing the number of germs on your skin.  CHG (Chlorhexidine gluconate) soap is an antiseptic cleanser which kills germs and bonds with the skin to continue killing germs even after washing.  Please do not use if you have an allergy to CHG or antibacterial soaps. If your skin becomes reddened/irritated stop using the CHG.  1. Shower the NIGHT BEFORE SURGERY and the MORNING OF SURGERY with CHG soap.  2. If you choose to wash your hair, wash your hair first as usual with your normal shampoo.  3. After shampooing, rinse your hair and body thoroughly to remove the shampoo.  4. Use CHG as you would any other liquid soap. You can apply CHG directly to the skin and wash gently with a scrungie or a clean washcloth.  5. Apply the CHG soap to your body only from the neck down. Do not use on open wounds or open sores. Avoid contact with your eyes, ears, mouth, and genitals (private parts). Wash face and genitals (private parts) with your normal soap.  6. Wash thoroughly, paying special attention to the area where your surgery  will be performed.  7. Thoroughly rinse your body with warm water.  8. Do not shower/wash with your normal soap after using and rinsing off the CHG soap.  9. Pat yourself dry with a clean towel.  10. Wear clean pajamas to bed the night before surgery.  12. Place clean sheets on your bed the night of your first shower and do not sleep with pets.  13. Shower again with the CHG soap on the day of surgery prior to arriving at the hospital.  14. Do not apply any deodorants/lotions/powders.  15. Please wear clean clothes to the hospital.
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
## 2022-11-21 ENCOUNTER — Encounter
Admission: RE | Admit: 2022-11-21 | Discharge: 2022-11-21 | Disposition: A | Discharge status: Home / Self Care | Payer: Medicare PPO | Source: Ambulatory Visit | Attending: Specialist | Admitting: Specialist

## 2022-11-21 DIAGNOSIS — Z01818 Encounter for other preprocedural examination: Secondary | ICD-10-CM | POA: Insufficient documentation

## 2022-11-21 DIAGNOSIS — I10 Essential (primary) hypertension: Secondary | ICD-10-CM | POA: Diagnosis not present

## 2022-11-21 DIAGNOSIS — Z01810 Encounter for preprocedural cardiovascular examination: Secondary | ICD-10-CM

## 2022-11-21 DIAGNOSIS — Z01812 Encounter for preprocedural laboratory examination: Secondary | ICD-10-CM

## 2022-11-21 DIAGNOSIS — E119 Type 2 diabetes mellitus without complications: Secondary | ICD-10-CM | POA: Insufficient documentation

## 2022-11-21 DIAGNOSIS — Z79899 Other long term (current) drug therapy: Secondary | ICD-10-CM | POA: Diagnosis not present

## 2022-11-21 LAB — BASIC METABOLIC PANEL
Anion gap: 11 (ref 5–15)
BUN: 20 mg/dL (ref 8–23)
CO2: 25 mmol/L (ref 22–32)
Calcium: 9 mg/dL (ref 8.9–10.3)
Chloride: 100 mmol/L (ref 98–111)
Creatinine, Ser: 0.74 mg/dL (ref 0.44–1.00)
GFR, Estimated: >60 mL/min (ref >60)
Glucose, Bld: 112 mg/dL — ABNORMAL HIGH (ref 70–99)
Potassium: 3.6 mmol/L (ref 3.5–5.1)
Sodium: 136 mmol/L (ref 135–145)

## 2022-11-21 LAB — CBC
HCT: 41.2 % (ref 36.0–46.0)
Hemoglobin: 13.7 g/dL (ref 12.0–15.0)
MCH: 30.1 pg (ref 26.0–34.0)
MCHC: 33.3 g/dL (ref 30.0–36.0)
MCV: 90.5 fL (ref 80.0–100.0)
Platelets: 128 10*3/uL — ABNORMAL LOW (ref 150–400)
RBC: 4.55 MIL/uL (ref 3.87–5.11)
RDW: 14.2 % (ref 11.5–15.5)
WBC: 8.5 10*3/uL (ref 4.0–10.5)
nRBC: 0 % (ref 0.0–0.2)

## 2022-11-22 LAB — HEMOGLOBIN A1C
Hgb A1c MFr Bld: 5.7 % — ABNORMAL HIGH (ref 4.8–5.6)
Mean Plasma Glucose: 116.89 mg/dL

## 2022-11-24 ENCOUNTER — Ambulatory Visit: Payer: Self-pay | Admitting: Urgent Care

## 2022-11-24 ENCOUNTER — Encounter: Payer: Self-pay | Admitting: Specialist

## 2022-11-24 ENCOUNTER — Other Ambulatory Visit: Payer: Self-pay

## 2022-11-24 ENCOUNTER — Encounter
Admission: RE | Disposition: A | Discharge status: Home / Self Care | Payer: Self-pay | Source: Home / Self Care | Attending: Specialist

## 2022-11-24 ENCOUNTER — Ambulatory Visit: Payer: Medicare PPO | Admitting: Registered Nurse

## 2022-11-24 ENCOUNTER — Ambulatory Visit
Admission: RE | Admit: 2022-11-24 | Discharge: 2022-11-24 | Disposition: A | Discharge status: Home / Self Care | Payer: Medicare PPO | Attending: Specialist | Admitting: Specialist

## 2022-11-24 ENCOUNTER — Ambulatory Visit: Payer: Medicare PPO

## 2022-11-24 DIAGNOSIS — Z7984 Long term (current) use of oral hypoglycemic drugs: Secondary | ICD-10-CM | POA: Insufficient documentation

## 2022-11-24 DIAGNOSIS — M1811 Unilateral primary osteoarthritis of first carpometacarpal joint, right hand: Secondary | ICD-10-CM | POA: Insufficient documentation

## 2022-11-24 DIAGNOSIS — E119 Type 2 diabetes mellitus without complications: Secondary | ICD-10-CM | POA: Insufficient documentation

## 2022-11-24 DIAGNOSIS — Z01818 Encounter for other preprocedural examination: Secondary | ICD-10-CM

## 2022-11-24 DIAGNOSIS — M67431 Ganglion, right wrist: Secondary | ICD-10-CM | POA: Diagnosis present

## 2022-11-24 DIAGNOSIS — Z01812 Encounter for preprocedural laboratory examination: Secondary | ICD-10-CM

## 2022-11-24 HISTORY — PX: GANGLION CYST EXCISION: SHX1691

## 2022-11-24 HISTORY — PX: CARPOMETACARPAL (CMC) FUSION OF THUMB: SHX6290

## 2022-11-24 LAB — GLUCOSE, CAPILLARY
Glucose-Capillary: 111 mg/dL — ABNORMAL HIGH (ref 70–99)
Glucose-Capillary: 146 mg/dL — ABNORMAL HIGH (ref 70–99)

## 2022-11-24 SURGERY — CARPOMETACARPAL (CMC) FUSION OF THUMB
Anesthesia: General | Site: Wrist | Laterality: Right

## 2022-11-24 MED ORDER — NEOMYCIN-POLYMYXIN B GU 40-200000 IR SOLN
Status: AC
  Filled 2022-11-24: qty 20

## 2022-11-24 MED ORDER — EPHEDRINE SULFATE (PRESSORS) 50 MG/ML IJ SOLN: INTRAMUSCULAR | Status: DC | PRN

## 2022-11-24 MED ORDER — LIDOCAINE HCL (PF) 2 % IJ SOLN
INTRAMUSCULAR | Status: AC
  Filled 2022-11-24: qty 5

## 2022-11-24 MED ORDER — CEFAZOLIN SODIUM-DEXTROSE 2-4 GM/100ML-% IV SOLN
INTRAVENOUS | Status: AC
  Filled 2022-11-24: qty 100

## 2022-11-24 MED ORDER — PROPOFOL 10 MG/ML IV BOLUS
INTRAVENOUS | Status: DC | PRN
  Administered 2022-11-24: 130 mg via INTRAVENOUS

## 2022-11-24 MED ORDER — GABAPENTIN 400 MG PO CAPS: 400.0000 mg | ORAL_CAPSULE | Freq: Three times a day (TID) | ORAL | 3 refills | Status: AC

## 2022-11-24 MED ORDER — ACETAMINOPHEN 10 MG/ML IV SOLN
INTRAVENOUS | Status: AC
  Filled 2022-11-24: qty 100

## 2022-11-24 MED ORDER — FENTANYL CITRATE (PF) 100 MCG/2ML IJ SOLN
INTRAMUSCULAR | Status: AC
  Filled 2022-11-24: qty 2

## 2022-11-24 MED ORDER — DROPERIDOL 2.5 MG/ML IJ SOLN: 0.6250 mg | Freq: Once | INTRAMUSCULAR | Status: DC | PRN

## 2022-11-24 MED ORDER — CEFAZOLIN SODIUM-DEXTROSE 2-4 GM/100ML-% IV SOLN
2.0000 g | INTRAVENOUS | Status: AC
  Administered 2022-11-24: 2 g via INTRAVENOUS

## 2022-11-24 MED ORDER — EPHEDRINE 5 MG/ML INJ
INTRAVENOUS | Status: AC
  Filled 2022-11-24: qty 5

## 2022-11-24 MED ORDER — ONDANSETRON HCL 4 MG/2ML IJ SOLN
INTRAMUSCULAR | Status: AC
  Filled 2022-11-24: qty 2

## 2022-11-24 MED ORDER — SODIUM CHLORIDE 0.9 % IV SOLN: INTRAVENOUS | Status: DC

## 2022-11-24 MED ORDER — BUPIVACAINE HCL (PF) 0.5 % IJ SOLN
INTRAMUSCULAR | Status: AC
  Filled 2022-11-24: qty 30

## 2022-11-24 MED ORDER — MELOXICAM 7.5 MG PO TABS
15.0000 mg | ORAL_TABLET | ORAL | Status: AC
  Administered 2022-11-24: 15 mg via ORAL

## 2022-11-24 MED ORDER — TRAMADOL HCL 50 MG PO TABS
50.0000 mg | ORAL_TABLET | Freq: Four times a day (QID) | ORAL | 3 refills | Status: AC | PRN
Start: 2022-11-24 — End: ?

## 2022-11-24 MED ORDER — CHLORHEXIDINE GLUCONATE CLOTH 2 % EX PADS
6.0000 | MEDICATED_PAD | Freq: Once | CUTANEOUS | Status: AC
  Administered 2022-11-24: 6 via TOPICAL

## 2022-11-24 MED ORDER — LIDOCAINE HCL (CARDIAC) PF 100 MG/5ML IV SOSY
PREFILLED_SYRINGE | INTRAVENOUS | Status: DC | PRN
  Administered 2022-11-24: 100 mg via INTRAVENOUS

## 2022-11-24 MED ORDER — PHENYLEPHRINE 80 MCG/ML (10ML) SYRINGE FOR IV PUSH (FOR BLOOD PRESSURE SUPPORT)
PREFILLED_SYRINGE | INTRAVENOUS | Status: DC | PRN
  Administered 2022-11-24: 160 ug via INTRAVENOUS
  Administered 2022-11-24: 40 ug via INTRAVENOUS

## 2022-11-24 MED ORDER — GABAPENTIN 300 MG PO CAPS
300.0000 mg | ORAL_CAPSULE | ORAL | Status: AC
  Administered 2022-11-24: 300 mg via ORAL

## 2022-11-24 MED ORDER — DEXAMETHASONE SODIUM PHOSPHATE 10 MG/ML IJ SOLN
INTRAMUSCULAR | Status: DC | PRN
  Administered 2022-11-24: 5 mg via INTRAVENOUS

## 2022-11-24 MED ORDER — MIDAZOLAM HCL 2 MG/2ML IJ SOLN
INTRAMUSCULAR | Status: AC
  Filled 2022-11-24: qty 2

## 2022-11-24 MED ORDER — ONDANSETRON HCL 4 MG/2ML IJ SOLN
INTRAMUSCULAR | Status: DC | PRN
  Administered 2022-11-24: 4 mg via INTRAVENOUS

## 2022-11-24 MED ORDER — PROPOFOL 10 MG/ML IV BOLUS
INTRAVENOUS | Status: AC
  Filled 2022-11-24: qty 20

## 2022-11-24 MED ORDER — MIDAZOLAM HCL 2 MG/2ML IJ SOLN
INTRAMUSCULAR | Status: DC | PRN
  Administered 2022-11-24: 2 mg via INTRAVENOUS

## 2022-11-24 MED ORDER — CHLORHEXIDINE GLUCONATE 0.12 % MT SOLN
OROMUCOSAL | Status: AC
  Filled 2022-11-24: qty 15

## 2022-11-24 MED ORDER — ORAL CARE MOUTH RINSE: 15.0000 mL | Freq: Once | OROMUCOSAL | Status: AC

## 2022-11-24 MED ORDER — GABAPENTIN 300 MG PO CAPS
ORAL_CAPSULE | ORAL | Status: AC
  Filled 2022-11-24: qty 1

## 2022-11-24 MED ORDER — MELOXICAM 7.5 MG PO TABS
ORAL_TABLET | ORAL | Status: AC
  Filled 2022-11-24: qty 2

## 2022-11-24 MED ORDER — FENTANYL CITRATE (PF) 100 MCG/2ML IJ SOLN
25.0000 ug | INTRAMUSCULAR | Status: DC | PRN
  Administered 2022-11-24 (×2): 50 ug via INTRAVENOUS

## 2022-11-24 MED ORDER — DEXAMETHASONE SODIUM PHOSPHATE 10 MG/ML IJ SOLN
INTRAMUSCULAR | Status: AC
  Filled 2022-11-24: qty 1

## 2022-11-24 MED ORDER — CHLORHEXIDINE GLUCONATE 0.12 % MT SOLN
15.0000 mL | Freq: Once | OROMUCOSAL | Status: AC
  Administered 2022-11-24: 15 mL via OROMUCOSAL

## 2022-11-24 MED ORDER — SODIUM CHLORIDE 0.9 % IV SOLN
Status: DC | PRN
  Administered 2022-11-24: 50 mL

## 2022-11-24 MED ORDER — ACETAMINOPHEN 10 MG/ML IV SOLN
INTRAVENOUS | Status: DC | PRN
  Administered 2022-11-24: 1000 mg via INTRAVENOUS

## 2022-11-24 MED ORDER — CEFAZOLIN SODIUM-DEXTROSE 2-4 GM/100ML-% IV SOLN: 2.0000 g | INTRAVENOUS | Status: DC

## 2022-11-24 MED ORDER — EPHEDRINE SULFATE-NACL 50-0.9 MG/10ML-% IV SOSY
PREFILLED_SYRINGE | INTRAVENOUS | Status: DC | PRN
Start: 2022-11-24 — End: 2022-12-03
  Administered 2022-11-24 (×2): 5 mg via INTRAVENOUS
  Administered 2022-11-24: 10 mg via INTRAVENOUS

## 2022-11-24 MED ORDER — FENTANYL CITRATE (PF) 100 MCG/2ML IJ SOLN
INTRAMUSCULAR | Status: DC | PRN
  Administered 2022-11-24: 25 ug via INTRAVENOUS
  Administered 2022-11-24: 50 ug via INTRAVENOUS
  Administered 2022-11-24: 25 ug via INTRAVENOUS

## 2022-11-24 MED ORDER — BUPIVACAINE HCL (PF) 0.5 % IJ SOLN
INTRAMUSCULAR | Status: DC | PRN
  Administered 2022-11-24: 40 mL

## 2022-11-24 SURGICAL SUPPLY — 53 items
APL PRP STRL LF DISP 70% ISPRP (MISCELLANEOUS) ×2
BLADE OSC/SAGITTAL MD 5.5X18 (BLADE) ×2 IMPLANT
BLADE SURG MINI STRL (BLADE) ×2 IMPLANT
BNDG ESMARCH 4 X 12 STRL LF (GAUZE/BANDAGES/DRESSINGS) ×2
BNDG ESMARCH 4X12 STRL LF (GAUZE/BANDAGES/DRESSINGS) ×2 IMPLANT
CHLORAPREP W/TINT 26 (MISCELLANEOUS) ×2 IMPLANT
CUFF TOURN SGL QUICK 18X4 (TOURNIQUET CUFF) ×2 IMPLANT
DRAPE FLUOR MINI C-ARM 54X84 (DRAPES) ×2 IMPLANT
DRSG GAUZE FLUFF 36X18 (GAUZE/BANDAGES/DRESSINGS) ×6 IMPLANT
ELECT REM PT RETURN 9FT ADLT (ELECTROSURGICAL) ×2
ELECTRODE REM PT RTRN 9FT ADLT (ELECTROSURGICAL) ×2 IMPLANT
GAUZE SPONGE 4X4 12PLY STRL (GAUZE/BANDAGES/DRESSINGS) ×2 IMPLANT
GAUZE XEROFORM 1X8 LF (GAUZE/BANDAGES/DRESSINGS) ×2 IMPLANT
GLOVE BIO SURGEON STRL SZ7.5 (GLOVE) ×2 IMPLANT
GLOVE BIO SURGEON STRL SZ8 (GLOVE) IMPLANT
GLOVE SURG ORTHO 8.0 STRL STRW (GLOVE) ×2 IMPLANT
GOWN STRL REUS W/ TWL LRG LVL3 (GOWN DISPOSABLE) ×2 IMPLANT
GOWN STRL REUS W/TWL LRG LVL3 (GOWN DISPOSABLE) ×2
GOWN STRL REUS W/TWL LRG LVL4 (GOWN DISPOSABLE) ×2 IMPLANT
KIT TURNOVER KIT A (KITS) ×2 IMPLANT
LOOP VESSEL MINI 0.8X406 BLUE (MISCELLANEOUS) ×2 IMPLANT
MANIFOLD NEPTUNE II (INSTRUMENTS) ×2 IMPLANT
NDL FILTER BLUNT 18X1 1/2 (NEEDLE) ×2 IMPLANT
NEEDLE FILTER BLUNT 18X1 1/2 (NEEDLE) ×2 IMPLANT
NS IRRIG 500ML POUR BTL (IV SOLUTION) ×2 IMPLANT
PACK EXTREMITY ARMC (MISCELLANEOUS) ×2 IMPLANT
PAD CAST 4YDX4 CTTN HI CHSV (CAST SUPPLIES) ×2 IMPLANT
PAD PREP OB/GYN DISP 24X41 (PERSONAL CARE ITEMS) ×2 IMPLANT
PADDING CAST COTTON 4X4 STRL (CAST SUPPLIES) ×2
PASSER SUT SWANSON 36MM LOOP (INSTRUMENTS) ×2 IMPLANT
SPIKE FLUID TRANSFER (MISCELLANEOUS) ×2 IMPLANT
SPLINT CAST 1 STEP 3X12 (MISCELLANEOUS) ×2 IMPLANT
SPONGE T-LAP 18X18 ~~LOC~~+RFID (SPONGE) ×2 IMPLANT
STOCKINETTE 48X4 2 PLY STRL (GAUZE/BANDAGES/DRESSINGS) ×2 IMPLANT
STOCKINETTE BIAS CUT 4 980044 (GAUZE/BANDAGES/DRESSINGS) ×2 IMPLANT
STOCKINETTE STRL 4IN 9604848 (GAUZE/BANDAGES/DRESSINGS) ×2 IMPLANT
STRAP SAFETY 5IN WIDE (MISCELLANEOUS) ×2 IMPLANT
SUT ETHILON 4-0 (SUTURE) ×2
SUT ETHILON 4-0 FS2 18XMFL BLK (SUTURE) ×2
SUT ETHILON 5-0 FS-2 18 BLK (SUTURE) ×2 IMPLANT
SUT VIC AB 2-0 SH 27 (SUTURE) ×2
SUT VIC AB 2-0 SH 27XBRD (SUTURE) ×2 IMPLANT
SUT VIC AB 3-0 SH 27 (SUTURE) ×2
SUT VIC AB 3-0 SH 27X BRD (SUTURE) ×2 IMPLANT
SUT VIC AB 4-0 FS2 27 (SUTURE) ×2 IMPLANT
SUT VIC AB 4-0 SH 27 (SUTURE) ×2
SUT VIC AB 4-0 SH 27XANBCTRL (SUTURE) ×2 IMPLANT
SUTURE ETHLN 4-0 FS2 18XMF BLK (SUTURE) ×2 IMPLANT
SYR 30ML LL (SYRINGE) ×2 IMPLANT
SYR 5ML LL (SYRINGE) ×2 IMPLANT
TRAP FLUID SMOKE EVACUATOR (MISCELLANEOUS) ×2 IMPLANT
WATER STERILE IRR 500ML POUR (IV SOLUTION) ×2 IMPLANT
WIRE Z .062 C-WIRE SPADE TIP (WIRE) ×2 IMPLANT

## 2022-11-24 NOTE — Op Note (Signed)
11/24/2022  11:43 AM  PATIENT:  Laurie [name]    PRE-OPERATIVE DIAGNOSIS:  U04.540 Ganglion right wrist 2) severe arthritis right thumb CMC joint  POST-OPERATIVE DIAGNOSIS:  Same  PROCEDURE:   1)REMOVAL GANGLION OF right WRIST 2)  CARPOMETACARPAL (CMC) arthroplasty OF RIGHT THUMB using palmaris longus tendon,   SURGEON:  Valinda Hoar, MD  ANESTHESIA:   General  PREOPERATIVE INDICATIONS:  Laurie [name] is a  67 y.o. female with a diagnosis of M67.431 Ganglion right wrist who failed conservative measures and elected for surgical management.    The risks benefits and alternatives were discussed with the patient preoperatively including but not limited to the risks of infection, bleeding, nerve injury, cardiopulmonary complications, the need for revision surgery, among others, and the patient was willing to proceed.  EBL: None  TOURNIQUET TIME: 115 MIN  OPERATIVE IMPLANTS: None  OPERATIVE FINDINGS: Advanced arthritis right thumb cmc joint.  Dorsal ganglion right wrist joint  OPERATIVE PROCEDURE:  The patient was brought to the operating room and underwent satisfactory general anesthesia in the supine position.  The operative arm was prepped and draped in a sterile fashion.   The dorsal ganglion was approached first.  Using loupe magnification a transverse incision was made over the dorsal ganglion and exposure carried out using tenotomy scissors and small hemost This tendon then this tat.  The ganglion was intimately involved with the dorsal capsule.  It was dissected out down through the capsule and amputated at its base.  Extensor tendons were separated and were not involved.  The wound was irrigated and closed with 4-0 Vicryl and 5-0 nylon.    Next, the patient was noted to have an excellent palmaris longus tendon.  3 short transverse incisions were made over the course of the tendon and it was harvested under direct vision.  It was then placed in a moist sponge on the back  table.  An S-shaped incision was then made over the base of the thumb metacarpal dorsally.  Dissection was carried out carefully under loupe magnification, carefully sparing the sensory nerves.  The tendon sheath around the abductor pollicis longus and extensor pollicis brevis was released under direct vision, including over the radial styloid.  Nerves were carefully spared.  Retractors were inserted and the capsule of the trapezium and metacarpal were opened longitudinally.  The radial artery and veins were carefully dissected free and retracted with a vessel loop drain.  The capsule was dissected off the trapezium and the trapezium was then morselized with the oscillating saw and rongeur.  The flexor carpi radialis tendon was freed up in the base of the wound.  The palmaris longus tendon was passed beneath this.  A 3.5 mm drill was used to create a tunnel through the base of the metacarpal.  The tendon was then passed up through the metacarpal using a tendon passer.  The thumb had been placed in traction which was then released.  The tendon was tied upon itself and the knot was sutured to itself prevent slippage.  It was then tied in multiple knots and sutured into a ball.  This was then rotated into the space between the metacarpal and the scaphoid.  The capsule was then carefully and thoroughly, closed with 4-0 Vicryl suture.  After irrigation, the skin was closed with 5-0 nylon.  The forearm wounds had been closed with a similar suture.  A well-padded thumb spica splint was applied.  Tourniquet was deflated with good return of blood flow  to the hand.  Sponge and needle counts were correct.  Patient was taken to recovery in good condition.  Valinda Hoar, MD

## 2022-11-24 NOTE — Anesthesia Procedure Notes (Signed)
Procedure Name: LMA Insertion Date/Time: 11/24/2022 9:14 AM  Performed by: Carson Bogden, [place], CRNAPre-anesthesia Checklist: Patient identified, Patient being monitored, Timeout performed, Emergency Drugs available and Suction available Patient Re-evaluated:Patient Re-evaluated prior to induction Oxygen Delivery Method: Circle system utilized Preoxygenation: Pre-oxygenation with 100% oxygen Induction Type: IV induction Ventilation: Mask ventilation without difficulty LMA: LMA inserted LMA Size: 4.0 Tube type: Oral Number of attempts: 1 Placement Confirmation: positive ETCO2 and breath sounds checked- equal and bilateral Tube secured with: Tape Dental Injury: Teeth and Oropharynx as per pre-operative assessment
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
## 2022-11-24 NOTE — H&P (Signed)
THE PATIENT WAS SEEN PRIOR TO SURGERY TODAY.  HISTORY, ALLERGIES, HOME MEDICATIONS AND OPERATIVE PROCEDURE WERE REVIEWED. RISKS AND BENEFITS OF SURGERY DISCUSSED WITH PATIENT AGAIN.  NO CHANGES FROM INITIAL HISTORY AND PHYSICAL NOTED.    

## 2022-11-24 NOTE — Discharge Instructions (Addendum)

## 2022-11-25 ENCOUNTER — Encounter: Payer: Self-pay | Admitting: Specialist

## 2022-11-27 LAB — SURGICAL PATHOLOGY

## 2022-12-03 ENCOUNTER — Encounter: Payer: Self-pay | Admitting: Specialist

## 2022-12-03 NOTE — Transfer of Care (Signed)
Immediate Anesthesia Transfer of Care Note  Patient: Laurie [name]  Procedure(s) Performed: CARPOMETACARPAL (CMC) FUSION OF THUMB (Right: Thumb) REMOVAL GANGLION OF WRIST (Right: Wrist)  Patient Location: PACU  Anesthesia Type:General  Level of Consciousness: sedated  Airway & Oxygen Therapy: Patient Spontanous Breathing and Patient connected to face mask oxygen  Post-op Assessment: Report given to RN and Post -op Vital signs reviewed and stable  Post vital signs: Reviewed and stable  Last Vitals:  Vitals Value Taken Time  BP 138/59 11/24/22 1256  Temp 36.6 C 11/24/22 1256  Pulse 78 11/24/22 1256  Resp 16 11/24/22 1256  SpO2 93 % 11/24/22 1256    Last Pain:  Vitals:   11/24/22 1256  TempSrc: Temporal  PainSc: 4          Complications: No notable events documented.

## 2022-12-03 NOTE — Anesthesia Postprocedure Evaluation (Signed)
Anesthesia Post Note  Patient: CARLEENA MIRES  Procedure(s) Performed: CARPOMETACARPAL (CMC) FUSION OF THUMB (Right: Thumb) REMOVAL GANGLION OF WRIST (Right: Wrist)  Patient location during evaluation: PACU Anesthesia Type: General Level of consciousness: awake and alert Pain management: pain level controlled Vital Signs Assessment: post-procedure vital signs reviewed and stable Respiratory status: spontaneous breathing, nonlabored ventilation, respiratory function stable and patient connected to nasal cannula oxygen Cardiovascular status: blood pressure returned to baseline and stable Postop Assessment: no apparent nausea or vomiting Anesthetic complications: no   No notable events documented.   Last Vitals:  Vitals:   11/24/22 1215 11/24/22 1256  BP:  (!) 138/59  Pulse: 82 78  Resp: 10 16  Temp: 36.9 C 36.6 C  SpO2: 98% 93%    Last Pain:  Vitals:   11/24/22 1256  TempSrc: Temporal  PainSc: 4                  Lenard Simmer

## 2023-01-09 ENCOUNTER — Inpatient Hospital Stay: Payer: Medicare PPO

## 2023-01-09 ENCOUNTER — Ambulatory Visit: Payer: Medicare PPO | Admitting: Oncology

## 2023-01-09 ENCOUNTER — Encounter: Payer: Self-pay | Admitting: Oncology

## 2023-01-09 ENCOUNTER — Inpatient Hospital Stay: Payer: Medicare PPO | Attending: Oncology | Admitting: Oncology

## 2023-01-09 DIAGNOSIS — R161 Splenomegaly, not elsewhere classified: Secondary | ICD-10-CM | POA: Diagnosis not present

## 2023-01-09 DIAGNOSIS — Z79899 Other long term (current) drug therapy: Secondary | ICD-10-CM | POA: Diagnosis not present

## 2023-01-09 LAB — CBC WITH DIFFERENTIAL/PLATELET
Abs Immature Granulocytes: 0.03 10*3/uL (ref 0.00–0.07)
Basophils Absolute: 0.1 10*3/uL (ref 0.0–0.1)
Basophils Relative: 1 %
Eosinophils Absolute: 0.2 10*3/uL (ref 0.0–0.5)
Eosinophils Relative: 4 %
HCT: 37.3 % (ref 36.0–46.0)
Hemoglobin: 12.2 g/dL (ref 12.0–15.0)
Immature Granulocytes: 1 %
Lymphocytes Relative: 23 %
Lymphs Abs: 1.1 10*3/uL (ref 0.7–4.0)
MCH: 30.3 pg (ref 26.0–34.0)
MCHC: 32.7 g/dL (ref 30.0–36.0)
MCV: 92.6 fL (ref 80.0–100.0)
Monocytes Absolute: 0.5 10*3/uL (ref 0.1–1.0)
Monocytes Relative: 10 %
Neutro Abs: 3.1 10*3/uL (ref 1.7–7.7)
Neutrophils Relative %: 61 %
Platelets: 110 10*3/uL — ABNORMAL LOW (ref 150–400)
RBC: 4.03 MIL/uL (ref 3.87–5.11)
RDW: 14.2 % (ref 11.5–15.5)
Smear Review: NORMAL
WBC: 5 10*3/uL (ref 4.0–10.5)
nRBC: 0 % (ref 0.0–0.2)

## 2023-01-09 LAB — COMPREHENSIVE METABOLIC PANEL
ALT: 20 U/L (ref 0–44)
AST: 28 U/L (ref 15–41)
Albumin: 4.1 g/dL (ref 3.5–5.0)
Alkaline Phosphatase: 115 U/L (ref 38–126)
Anion gap: 11 (ref 5–15)
BUN: 13 mg/dL (ref 8–23)
CO2: 26 mmol/L (ref 22–32)
Calcium: 8.9 mg/dL (ref 8.9–10.3)
Chloride: 101 mmol/L (ref 98–111)
Creatinine, Ser: 0.74 mg/dL (ref 0.44–1.00)
GFR, Estimated: >60 mL/min (ref >60)
Glucose, Bld: 111 mg/dL — ABNORMAL HIGH (ref 70–99)
Potassium: 4.1 mmol/L (ref 3.5–5.1)
Sodium: 138 mmol/L (ref 135–145)
Total Bilirubin: 0.5 mg/dL (ref <1.2)
Total Protein: 7.3 g/dL (ref 6.5–8.1)

## 2023-01-09 LAB — IRON AND TIBC
Iron: 104 ug/dL (ref 28–170)
Saturation Ratios: 42 % — ABNORMAL HIGH (ref 10.4–31.8)
TIBC: 249 ug/dL — ABNORMAL LOW (ref 250–450)
UIBC: 145 ug/dL

## 2023-01-09 LAB — FERRITIN: Ferritin: 102 ng/mL (ref 11–307)

## 2023-01-09 LAB — TSH: TSH: 1.048 u[IU]/mL (ref 0.350–4.500)
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
## 2023-01-16 ENCOUNTER — Telehealth: Payer: Self-pay | Admitting: *Deleted

## 2023-01-16 ENCOUNTER — Other Ambulatory Visit: Payer: Self-pay | Admitting: *Deleted

## 2023-01-16 NOTE — Telephone Encounter (Signed)
-----   Message from Creig Hines sent at 01/10/2023  8:34 AM EST ----- Ferritin 102. Ok to schedule 1 session of phlebotomy and recheck ferritin after 3 weeks with H/H

## 2023-01-16 NOTE — Telephone Encounter (Signed)
I called the pt. And let her know that ferritin 102 and per [name] she wants pt. To come and get phlebotomy. She is available  for tom at 2:30 and then 3 weeks from tom. She will need to come with labs 12/17 12:45 and then phlebotomy if needed. Pt agreeable for the plan

## 2023-01-17 ENCOUNTER — Inpatient Hospital Stay: Payer: Medicare PPO

## 2023-01-17 ENCOUNTER — Other Ambulatory Visit: Payer: Self-pay | Admitting: Oncology

## 2023-01-17 ENCOUNTER — Ambulatory Visit
Admission: RE | Admit: 2023-01-17 | Discharge: 2023-01-17 | Disposition: A | Discharge status: Home / Self Care | Payer: Medicare PPO | Source: Ambulatory Visit | Attending: Oncology | Admitting: Oncology

## 2023-01-17 DIAGNOSIS — R161 Splenomegaly, not elsewhere classified: Secondary | ICD-10-CM

## 2023-01-17 LAB — HEMOCHROMATOSIS DNA-PCR(C282Y,H63D)

## 2023-01-17 MED ORDER — SODIUM CHLORIDE 0.9 % IV SOLN
INTRAVENOUS | Status: DC
  Filled 2023-01-17 (×2): qty 250

## 2023-01-17 NOTE — Progress Notes (Signed)
Pt here for phlebotomy. 250 ml pulled off per MD order. NS replacement given due to patient being NPO for MRI later this afternoon and c/o of cramping

## 2023-01-17 NOTE — Patient Instructions (Signed)

## 2023-01-30 IMAGING — MG MM DIGITAL SCREENING BILAT W/ TOMO AND CAD
8 of 14 series · 8 of 40 positions shown · non-contrast
Comparison: Previous exam(s).

CLINICAL DATA: Screening.

EXAM:
DIGITAL SCREENING BILATERAL MAMMOGRAM WITH TOMOSYNTHESIS AND CAD
TECHNIQUE: Bilateral screening digital craniocaudal and mediolateral oblique
mammograms were obtained. Bilateral screening digital breast
tomosynthesis was performed. The images were evaluated with
computer-aided detection.

[L CC synth-2D]
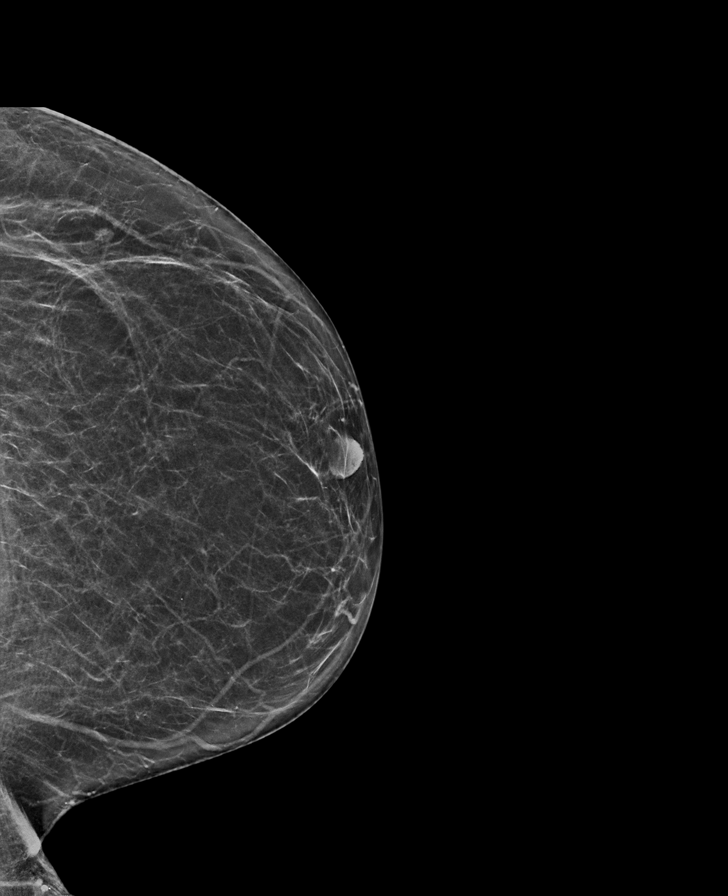

[R XCCL synth-2D]
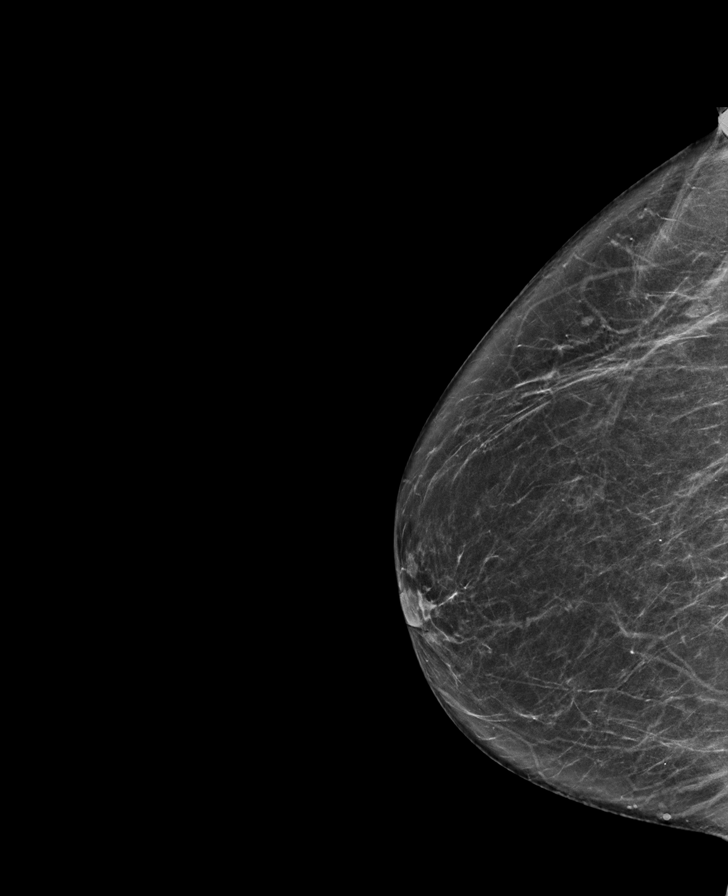

[L MLO synth-2D (1 of 2)]
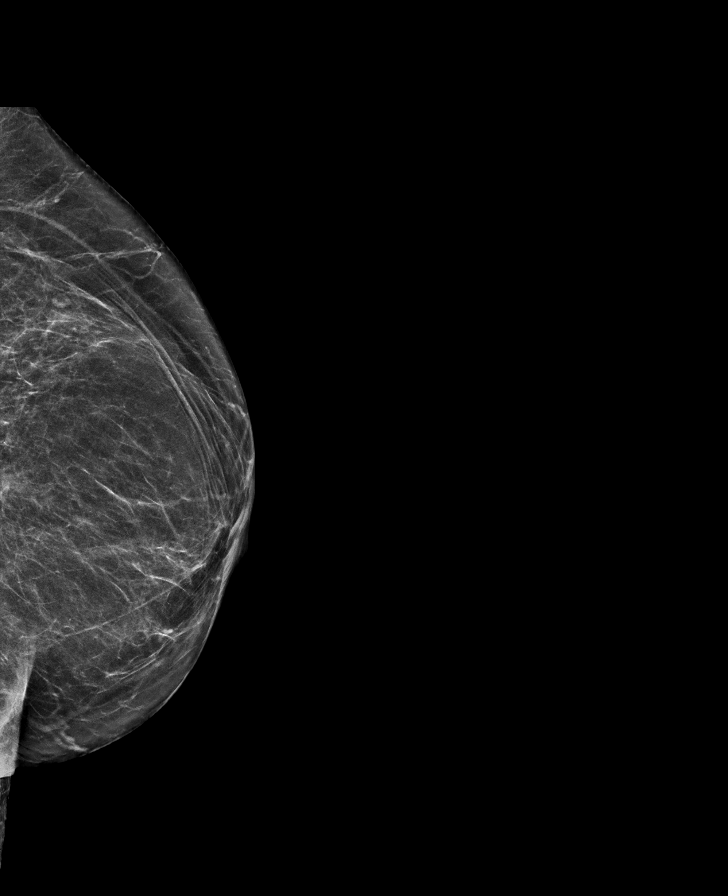

[R CC synth-2D]
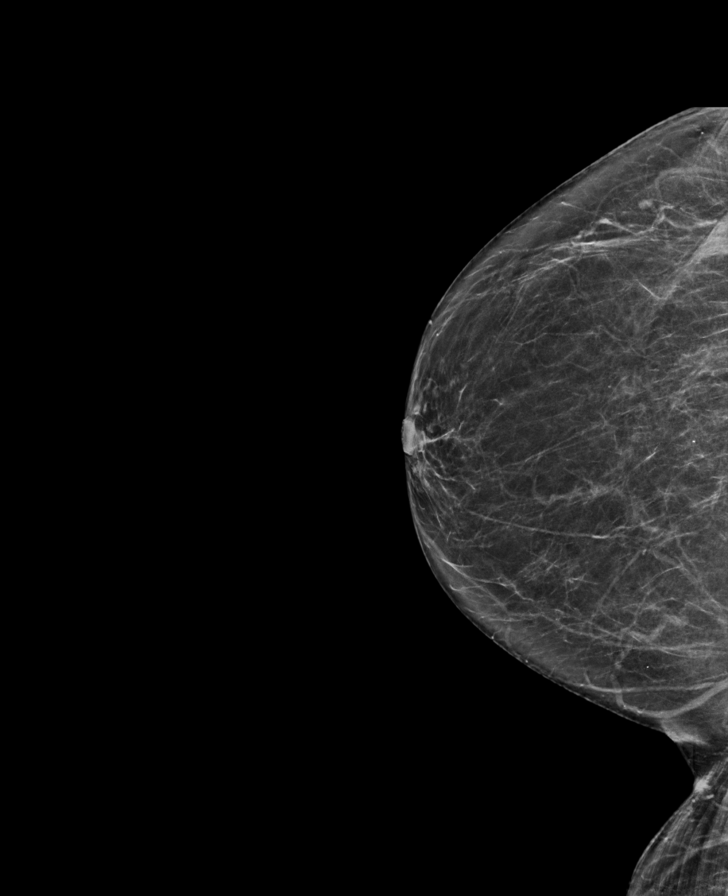

[R MLO synth-2D (1 of 2)]
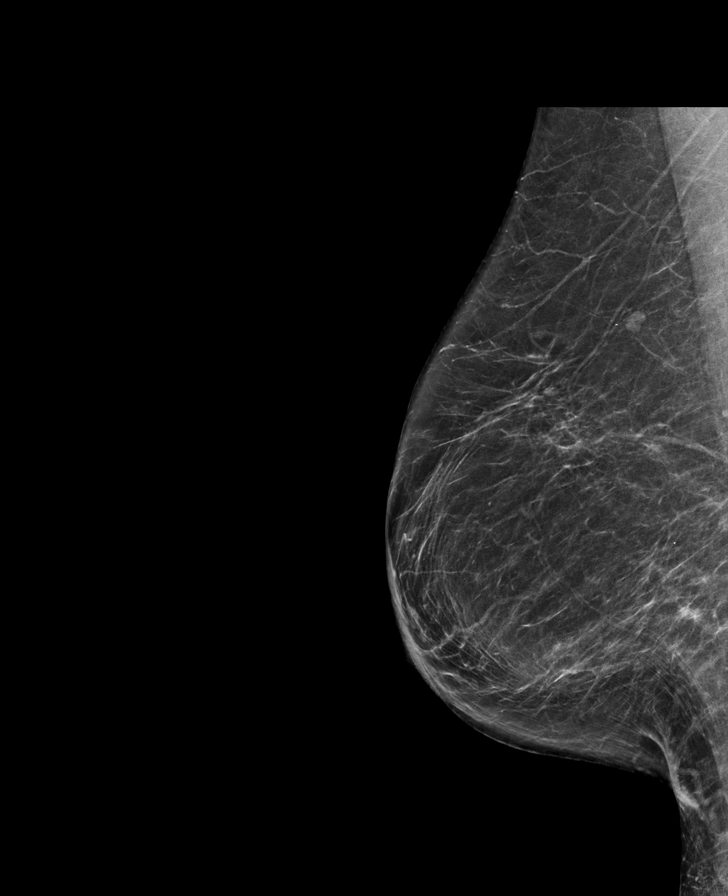

[R MLO synth-2D (2 of 2)]
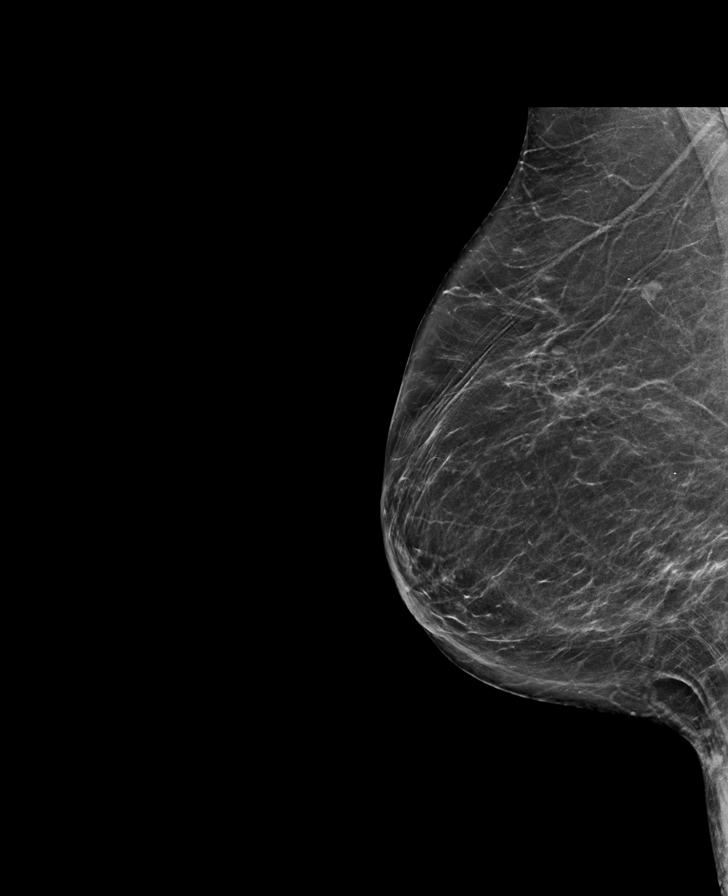

[L MLO synth-2D (2 of 2)]
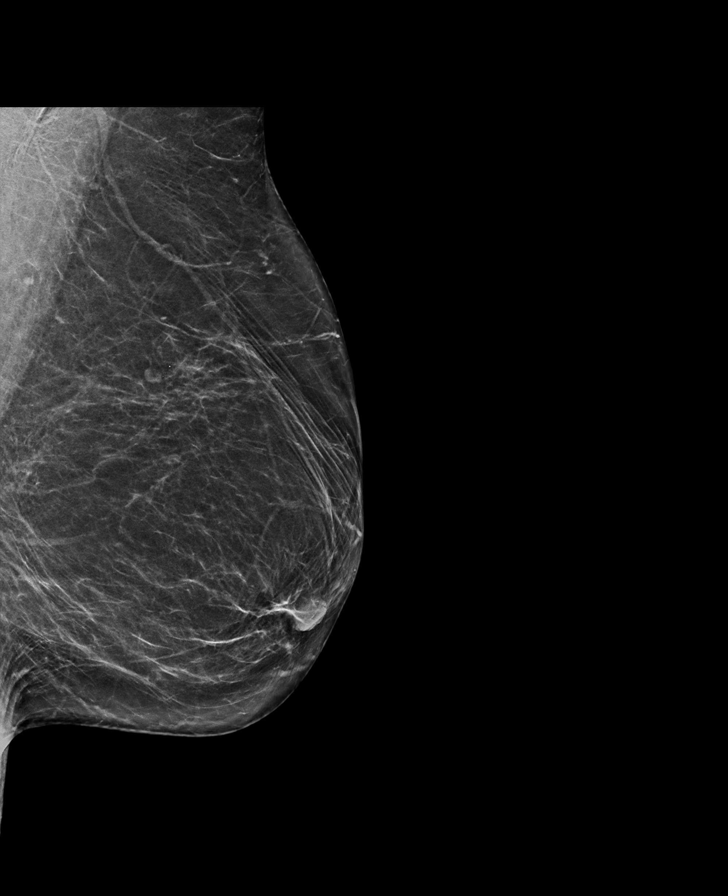

[R MLO tomo · tomo slice 34/67.0]
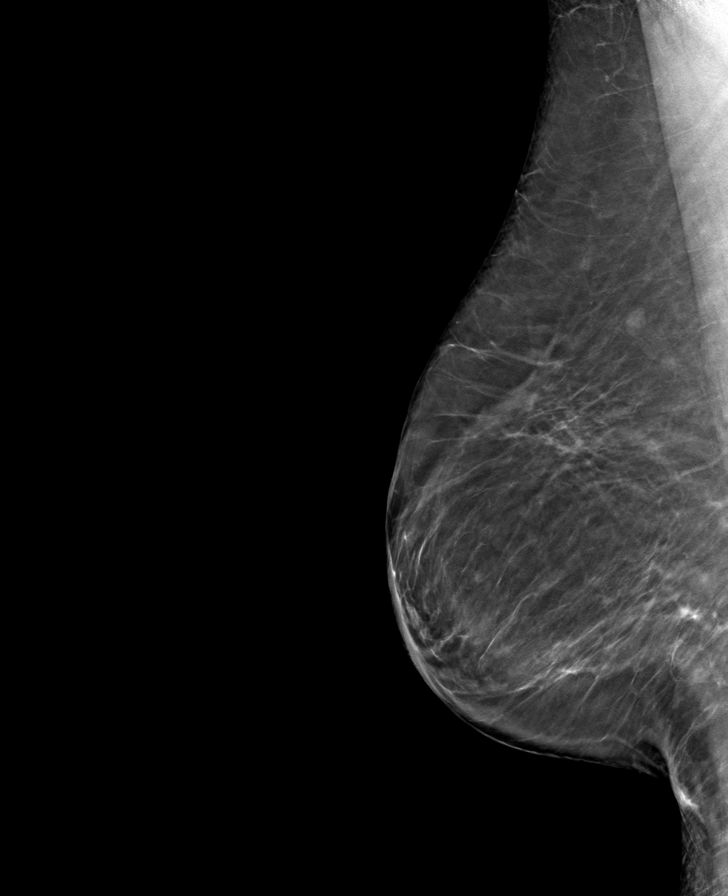

[8 of 40 positions shown; findings below may reference images not displayed]

ACR Breast Density Category b: There are scattered areas of
fibroglandular density.
FINDINGS: There are no findings suspicious for malignancy.
IMPRESSION: No mammographic evidence of malignancy. A result letter of this
screening mammogram will be mailed directly to the patient.

RECOMMENDATION:
Screening mammogram in one year. (Code:51-O-LD2)

BI-RADS CATEGORY  1: Negative.

## 2023-01-31 ENCOUNTER — Inpatient Hospital Stay: Payer: Medicare PPO | Attending: Oncology | Admitting: Oncology

## 2023-01-31 ENCOUNTER — Encounter: Payer: Self-pay | Admitting: Oncology

## 2023-02-07 ENCOUNTER — Other Ambulatory Visit: Payer: Self-pay | Admitting: *Deleted

## 2023-02-07 ENCOUNTER — Inpatient Hospital Stay: Payer: Medicare PPO

## 2023-02-10 ENCOUNTER — Inpatient Hospital Stay: Payer: Medicare PPO

## 2023-02-13 ENCOUNTER — Inpatient Hospital Stay: Payer: Medicare PPO

## 2023-02-13 LAB — HEMOGLOBIN AND HEMATOCRIT (CANCER CENTER ONLY)
HCT: 41.3 % (ref 36.0–46.0)
Hemoglobin: 13.8 g/dL (ref 12.0–15.0)

## 2023-02-13 LAB — FERRITIN: Ferritin: 156 ng/mL (ref 11–307)

## 2023-02-16 ENCOUNTER — Inpatient Hospital Stay (HOSPITAL_BASED_OUTPATIENT_CLINIC_OR_DEPARTMENT_OTHER): Payer: Medicare PPO | Admitting: Oncology

## 2023-02-17 ENCOUNTER — Encounter: Payer: Self-pay | Admitting: Oncology
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
## 2023-02-24 ENCOUNTER — Inpatient Hospital Stay: Payer: Medicare PPO | Attending: Oncology

## 2023-02-24 DIAGNOSIS — Z79899 Other long term (current) drug therapy: Secondary | ICD-10-CM | POA: Diagnosis not present

## 2023-03-03 ENCOUNTER — Inpatient Hospital Stay: Payer: Medicare PPO

## 2023-03-05 ENCOUNTER — Other Ambulatory Visit: Payer: Self-pay | Admitting: *Deleted

## 2023-03-06 ENCOUNTER — Inpatient Hospital Stay: Payer: Medicare PPO

## 2023-03-08 ENCOUNTER — Ambulatory Visit: Payer: Medicare PPO | Admitting: Podiatry

## 2023-03-21 ENCOUNTER — Encounter: Payer: Self-pay | Admitting: Oncology

## 2023-03-22 ENCOUNTER — Ambulatory Visit: Payer: Medicare PPO | Admitting: Podiatry

## 2023-04-12 ENCOUNTER — Ambulatory Visit: Payer: Medicare PPO | Admitting: Podiatry

## 2023-04-18 ENCOUNTER — Other Ambulatory Visit: Payer: Self-pay | Admitting: Family Medicine

## 2023-04-18 DIAGNOSIS — Z1231 Encounter for screening mammogram for malignant neoplasm of breast: Secondary | ICD-10-CM

## 2023-04-19 ENCOUNTER — Ambulatory Visit: Payer: Medicare PPO | Admitting: Podiatry

## 2023-05-12 ENCOUNTER — Inpatient Hospital Stay: Payer: Medicare PPO | Attending: Oncology

## 2023-05-12 ENCOUNTER — Inpatient Hospital Stay (HOSPITAL_BASED_OUTPATIENT_CLINIC_OR_DEPARTMENT_OTHER): Payer: Medicare PPO | Admitting: Oncology

## 2023-05-12 ENCOUNTER — Other Ambulatory Visit: Payer: Self-pay

## 2023-05-12 ENCOUNTER — Encounter: Payer: Self-pay | Admitting: Oncology

## 2023-05-12 DIAGNOSIS — Z807 Family history of other malignant neoplasms of lymphoid, hematopoietic and related tissues: Secondary | ICD-10-CM | POA: Insufficient documentation

## 2023-05-12 DIAGNOSIS — Z8051 Family history of malignant neoplasm of kidney: Secondary | ICD-10-CM | POA: Diagnosis not present

## 2023-05-12 DIAGNOSIS — R296 Repeated falls: Secondary | ICD-10-CM | POA: Insufficient documentation

## 2023-05-12 LAB — CBC WITH DIFFERENTIAL/PLATELET
Abs Immature Granulocytes: 0.04 10*3/uL (ref 0.00–0.07)
Basophils Absolute: 0.1 10*3/uL (ref 0.0–0.1)
Basophils Relative: 1 %
Eosinophils Absolute: 0.1 10*3/uL (ref 0.0–0.5)
Eosinophils Relative: 1 %
HCT: 39.6 % (ref 36.0–46.0)
Hemoglobin: 12.9 g/dL (ref 12.0–15.0)
Immature Granulocytes: 1 %
Lymphocytes Relative: 24 %
Lymphs Abs: 2.1 10*3/uL (ref 0.7–4.0)
MCH: 29.3 pg (ref 26.0–34.0)
MCHC: 32.6 g/dL (ref 30.0–36.0)
MCV: 90 fL (ref 80.0–100.0)
Monocytes Absolute: 0.8 10*3/uL (ref 0.1–1.0)
Monocytes Relative: 10 %
Neutro Abs: 5.6 10*3/uL (ref 1.7–7.7)
Neutrophils Relative %: 63 %
Platelets: 154 10*3/uL (ref 150–400)
RBC: 4.4 MIL/uL (ref 3.87–5.11)
RDW: 14.8 % (ref 11.5–15.5)
WBC: 8.6 10*3/uL (ref 4.0–10.5)
nRBC: 0 % (ref 0.0–0.2)

## 2023-05-12 LAB — FERRITIN: Ferritin: 72 ng/mL (ref 11–307)

## 2023-05-12 LAB — COMPREHENSIVE METABOLIC PANEL
ALT: 15 U/L (ref 0–44)
AST: 22 U/L (ref 15–41)
Albumin: 4.5 g/dL (ref 3.5–5.0)
Alkaline Phosphatase: 83 U/L (ref 38–126)
Anion gap: 10 (ref 5–15)
BUN: 19 mg/dL (ref 8–23)
CO2: 26 mmol/L (ref 22–32)
Calcium: 9 mg/dL (ref 8.9–10.3)
Chloride: 103 mmol/L (ref 98–111)
Creatinine, Ser: 0.79 mg/dL (ref 0.44–1.00)
GFR, Estimated: >60 mL/min (ref >60)
Glucose, Bld: 85 mg/dL (ref 70–99)
Potassium: 3.3 mmol/L — ABNORMAL LOW (ref 3.5–5.1)
Sodium: 139 mmol/L (ref 135–145)
Total Bilirubin: 0.9 mg/dL (ref 0.0–1.2)
Total Protein: 7.5 g/dL (ref 6.5–8.1)

## 2023-05-13 ENCOUNTER — Encounter: Payer: Self-pay | Admitting: Oncology

## 2023-05-13 LAB — AFP TUMOR MARKER: AFP, Serum, Tumor Marker: 3.5 ng/mL (ref 0.0–9.2)
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
## 2023-05-15 ENCOUNTER — Telehealth: Payer: Self-pay

## 2023-05-15 ENCOUNTER — Other Ambulatory Visit: Payer: Self-pay

## 2023-05-15 DIAGNOSIS — F339 Major depressive disorder, recurrent, unspecified: Secondary | ICD-10-CM

## 2023-05-15 NOTE — Telephone Encounter (Signed)
 Per provider send referral to Dr. Sherryll Burger at [facility] neurology for frequent falls. I have placed referral order and faxed required last encounter note, demographics, and insurance to [facility]

## 2023-06-01 ENCOUNTER — Other Ambulatory Visit: Payer: Self-pay | Admitting: Internal Medicine

## 2023-06-01 DIAGNOSIS — R079 Chest pain, unspecified: Secondary | ICD-10-CM

## 2023-06-05 ENCOUNTER — Other Ambulatory Visit: Payer: Self-pay | Admitting: Internal Medicine

## 2023-06-05 DIAGNOSIS — R079 Chest pain, unspecified: Secondary | ICD-10-CM

## 2023-06-07 ENCOUNTER — Other Ambulatory Visit: Payer: Self-pay | Admitting: Internal Medicine

## 2023-06-07 DIAGNOSIS — R079 Chest pain, unspecified: Secondary | ICD-10-CM

## 2023-06-12 ENCOUNTER — Encounter: Payer: Self-pay | Admitting: Oncology

## 2023-06-20 ENCOUNTER — Encounter

## 2023-06-23 ENCOUNTER — Ambulatory Visit
Admission: RE | Admit: 2023-06-23 | Discharge: 2023-06-23 | Disposition: A | Discharge status: Home / Self Care | Source: Ambulatory Visit | Attending: Oncology | Admitting: Oncology

## 2023-07-11 ENCOUNTER — Encounter: Attending: Internal Medicine

## 2023-08-10 ENCOUNTER — Other Ambulatory Visit: Payer: Self-pay

## 2023-08-11 ENCOUNTER — Inpatient Hospital Stay: Attending: Oncology

## 2023-08-11 LAB — CMP (CANCER CENTER ONLY)
ALT: 15 U/L (ref 0–44)
AST: 19 U/L (ref 15–41)
Albumin: 4.3 g/dL (ref 3.5–5.0)
Alkaline Phosphatase: 92 U/L (ref 38–126)
Anion gap: 9 (ref 5–15)
BUN: 15 mg/dL (ref 8–23)
CO2: 29 mmol/L (ref 22–32)
Calcium: 9.1 mg/dL (ref 8.9–10.3)
Chloride: 98 mmol/L (ref 98–111)
Creatinine: 0.75 mg/dL (ref 0.44–1.00)
GFR, Estimated: >60 mL/min (ref >60)
Glucose, Bld: 128 mg/dL — ABNORMAL HIGH (ref 70–99)
Potassium: 4 mmol/L (ref 3.5–5.1)
Sodium: 136 mmol/L (ref 135–145)
Total Bilirubin: 0.7 mg/dL (ref 0.0–1.2)
Total Protein: 7.3 g/dL (ref 6.5–8.1)

## 2023-08-11 LAB — CBC WITH DIFFERENTIAL (CANCER CENTER ONLY)
Abs Immature Granulocytes: 0.01 10*3/uL (ref 0.00–0.07)
Basophils Absolute: 0 10*3/uL (ref 0.0–0.1)
Basophils Relative: 1 %
Eosinophils Absolute: 0.3 10*3/uL (ref 0.0–0.5)
Eosinophils Relative: 5 %
HCT: 37.1 % (ref 36.0–46.0)
Hemoglobin: 11.9 g/dL — ABNORMAL LOW (ref 12.0–15.0)
Immature Granulocytes: 0 %
Lymphocytes Relative: 30 %
Lymphs Abs: 1.5 10*3/uL (ref 0.7–4.0)
MCH: 29.8 pg (ref 26.0–34.0)
MCHC: 32.1 g/dL (ref 30.0–36.0)
MCV: 92.8 fL (ref 80.0–100.0)
Monocytes Absolute: 0.5 10*3/uL (ref 0.1–1.0)
Monocytes Relative: 10 %
Neutro Abs: 2.6 10*3/uL (ref 1.7–7.7)
Neutrophils Relative %: 54 %
Platelet Count: 120 10*3/uL — ABNORMAL LOW (ref 150–400)
RBC: 4 MIL/uL (ref 3.87–5.11)
RDW: 14.5 % (ref 11.5–15.5)
WBC Count: 4.9 10*3/uL (ref 4.0–10.5)
nRBC: 0 % (ref 0.0–0.2)

## 2023-08-11 LAB — IRON AND TIBC
Iron: 88 ug/dL (ref 28–170)
Saturation Ratios: 36 % — ABNORMAL HIGH (ref 10.4–31.8)
TIBC: 248 ug/dL — ABNORMAL LOW (ref 250–450)
UIBC: 160 ug/dL

## 2023-08-11 LAB — FERRITIN: Ferritin: 55 ng/mL (ref 11–307)

## 2023-08-21 ENCOUNTER — Ambulatory Visit: Admitting: Podiatry

## 2023-09-04 ENCOUNTER — Ambulatory Visit: Admitting: Podiatry

## 2023-09-11 ENCOUNTER — Encounter: Admission: RE | Admit: 2023-09-11 | Source: Ambulatory Visit

## 2023-09-26 ENCOUNTER — Encounter: Admission: RE | Admit: 2023-09-26 | Source: Ambulatory Visit

## 2023-10-16 ENCOUNTER — Other Ambulatory Visit

## 2023-10-19 ENCOUNTER — Encounter: Admission: RE | Admit: 2023-10-19 | Source: Ambulatory Visit

## 2023-10-26 ENCOUNTER — Ambulatory Visit: Admission: RE | Admit: 2023-10-26 | Source: Ambulatory Visit

## 2023-11-17 ENCOUNTER — Encounter: Payer: Self-pay | Admitting: Oncology

## 2023-11-17 ENCOUNTER — Inpatient Hospital Stay: Admitting: Oncology

## 2023-11-17 ENCOUNTER — Inpatient Hospital Stay: Attending: Oncology

## 2023-11-17 DIAGNOSIS — K7689 Other specified diseases of liver: Secondary | ICD-10-CM | POA: Insufficient documentation

## 2023-11-17 LAB — CMP (CANCER CENTER ONLY)
ALT: 15 U/L (ref 0–44)
AST: 23 U/L (ref 15–41)
Albumin: 4.9 g/dL (ref 3.5–5.0)
Alkaline Phosphatase: 101 U/L (ref 38–126)
Anion gap: 10 (ref 5–15)
BUN: 20 mg/dL (ref 8–23)
CO2: 24 mmol/L (ref 22–32)
Calcium: 9.6 mg/dL (ref 8.9–10.3)
Chloride: 102 mmol/L (ref 98–111)
Creatinine: 0.94 mg/dL (ref 0.44–1.00)
GFR, Estimated: >60 mL/min (ref >60)
Glucose, Bld: 123 mg/dL — ABNORMAL HIGH (ref 70–99)
Potassium: 3.7 mmol/L (ref 3.5–5.1)
Sodium: 136 mmol/L (ref 135–145)
Total Bilirubin: 1.2 mg/dL (ref 0.0–1.2)
Total Protein: 7.8 g/dL (ref 6.5–8.1)

## 2023-11-17 LAB — CBC WITH DIFFERENTIAL (CANCER CENTER ONLY)
Abs Immature Granulocytes: 0.03 K/uL (ref 0.00–0.07)
Basophils Absolute: 0.1 K/uL (ref 0.0–0.1)
Basophils Relative: 1 %
Eosinophils Absolute: 0 K/uL (ref 0.0–0.5)
Eosinophils Relative: 0 %
HCT: 42.5 % (ref 36.0–46.0)
Hemoglobin: 14.8 g/dL (ref 12.0–15.0)
Immature Granulocytes: 0 %
Lymphocytes Relative: 16 %
Lymphs Abs: 1.4 K/uL (ref 0.7–4.0)
MCH: 30.7 pg (ref 26.0–34.0)
MCHC: 34.8 g/dL (ref 30.0–36.0)
MCV: 88.2 fL (ref 80.0–100.0)
Monocytes Absolute: 0.7 K/uL (ref 0.1–1.0)
Monocytes Relative: 8 %
Neutro Abs: 6.8 K/uL (ref 1.7–7.7)
Neutrophils Relative %: 75 %
Platelet Count: 175 K/uL (ref 150–400)
RBC: 4.82 MIL/uL (ref 3.87–5.11)
RDW: 14 % (ref 11.5–15.5)
WBC Count: 9 K/uL (ref 4.0–10.5)
nRBC: 0 % (ref 0.0–0.2)

## 2023-11-17 LAB — FERRITIN: Ferritin: 183 ng/mL (ref 11–307)

## 2023-11-17 NOTE — Progress Notes (Signed)
 Patient has a some personal issues going on and having some pain in her hands.
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
## 2023-11-18 LAB — AFP TUMOR MARKER: AFP, Serum, Tumor Marker: 3.9 ng/mL (ref 0.0–9.2)

## 2023-11-20 ENCOUNTER — Other Ambulatory Visit: Payer: Self-pay | Admitting: *Deleted

## 2023-11-20 NOTE — Telephone Encounter (Signed)
 Yes phlebotoym this week. Weekly H/H and phlebotoym for 2 more times and check ferritin in 2 weeks

## 2023-11-24 ENCOUNTER — Other Ambulatory Visit

## 2023-11-24 ENCOUNTER — Inpatient Hospital Stay

## 2023-11-24 ENCOUNTER — Other Ambulatory Visit: Payer: Self-pay

## 2023-11-24 ENCOUNTER — Encounter

## 2023-11-24 ENCOUNTER — Inpatient Hospital Stay: Attending: Oncology

## 2023-11-24 LAB — HEMOGLOBIN AND HEMATOCRIT (CANCER CENTER ONLY)
HCT: 32 % — ABNORMAL LOW (ref 36.0–46.0)
Hemoglobin: 10.8 g/dL — ABNORMAL LOW (ref 12.0–15.0)

## 2023-11-24 NOTE — Progress Notes (Signed)
 Noted pt Hemoglobin went from 14.8-10.8 within a week concerned about dropping the pts hemoglobin further. Contacted Dr. Melanee and no phlebotomy today per Dr. Melanee.  Discussed with patient and pt requested to speeak to either Dr. Melanee or her nurse. Jereld Phenix RN agreed to speak with pt for Dr. Melanee.

## 2023-11-27 ENCOUNTER — Encounter: Payer: Self-pay | Admitting: Oncology

## 2023-12-01 ENCOUNTER — Inpatient Hospital Stay

## 2023-12-01 ENCOUNTER — Encounter

## 2023-12-01 ENCOUNTER — Other Ambulatory Visit

## 2023-12-01 LAB — HEMOGLOBIN AND HEMATOCRIT (CANCER CENTER ONLY)
HCT: 34.2 % — ABNORMAL LOW (ref 36.0–46.0)
Hemoglobin: 11.2 g/dL — ABNORMAL LOW (ref 12.0–15.0)

## 2023-12-01 LAB — FERRITIN: Ferritin: 66 ng/mL (ref 11–307)

## 2023-12-01 NOTE — Progress Notes (Signed)
 No phlebotomy today will follow up with pt on Monday 12/04/2023 with Ferritin results and reschedule phlebotomy if needed.

## 2023-12-04 ENCOUNTER — Telehealth: Payer: Self-pay

## 2023-12-04 NOTE — Telephone Encounter (Signed)
 Patient's last ferritin was 66 (normal range 11-307); previous ferritin 2 weeks ago was 183.  Per Dr. Melanee cbc cmp ferritin iron in 3 & 6 months and see her in 6 months.  Patient is scheduled for LOV 03/08/24 @ 2pm and then labs and sees Dr. Melanee on 05/17/24 @ 2pm.  Blanchie call; informed of above.  Educated again on increasing intake of iron rich foods (dark leafy vegetables, red meat, iron rich cereals, et jaynie).  Patient states she cannot take iron pills per another doctor's advisement. Advised to reach out to the center for any new and/or worsening symptoms.

## 2023-12-14 ENCOUNTER — Encounter: Payer: Self-pay | Admitting: Oncology

## 2023-12-15 ENCOUNTER — Encounter: Payer: Self-pay | Admitting: Oncology

## 2024-03-08 ENCOUNTER — Inpatient Hospital Stay

## 2024-03-08 ENCOUNTER — Telehealth: Payer: Self-pay | Admitting: Oncology
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
## 2024-03-11 ENCOUNTER — Ambulatory Visit: Payer: Self-pay | Admitting: Oncology

## 2024-03-11 ENCOUNTER — Encounter: Payer: Self-pay | Admitting: Oncology

## 2024-03-11 ENCOUNTER — Inpatient Hospital Stay: Attending: Oncology

## 2024-03-11 LAB — CBC (CANCER CENTER ONLY)
HCT: 38 % (ref 36.0–46.0)
Hemoglobin: 12.7 g/dL (ref 12.0–15.0)
MCH: 31 pg (ref 26.0–34.0)
MCHC: 33.4 g/dL (ref 30.0–36.0)
MCV: 92.7 fL (ref 80.0–100.0)
Platelet Count: 105 K/uL — ABNORMAL LOW (ref 150–400)
RBC: 4.1 MIL/uL (ref 3.87–5.11)
RDW: 13.9 % (ref 11.5–15.5)
WBC Count: 5.8 K/uL (ref 4.0–10.5)
nRBC: 0 % (ref 0.0–0.2)

## 2024-03-11 LAB — CMP (CANCER CENTER ONLY)
ALT: 33 U/L (ref 0–44)
AST: 33 U/L (ref 15–41)
Albumin: 4.8 g/dL (ref 3.5–5.0)
Alkaline Phosphatase: 107 U/L (ref 38–126)
Anion gap: 9 (ref 5–15)
BUN: 17 mg/dL (ref 8–23)
CO2: 28 mmol/L (ref 22–32)
Calcium: 9.6 mg/dL (ref 8.9–10.3)
Chloride: 102 mmol/L (ref 98–111)
Creatinine: 0.63 mg/dL (ref 0.44–1.00)
GFR, Estimated: >60 mL/min
Glucose, Bld: 99 mg/dL (ref 70–99)
Potassium: 3.9 mmol/L (ref 3.5–5.1)
Sodium: 140 mmol/L (ref 135–145)
Total Bilirubin: 0.5 mg/dL (ref 0.0–1.2)
Total Protein: 7.3 g/dL (ref 6.5–8.1)

## 2024-03-11 LAB — IRON AND TIBC
Iron: 137 ug/dL (ref 28–170)
Saturation Ratios: 52 % — ABNORMAL HIGH (ref 10.4–31.8)
TIBC: 266 ug/dL (ref 250–450)
UIBC: 129 ug/dL

## 2024-03-11 LAB — FERRITIN: Ferritin: 121 ng/mL (ref 11–307)

## 2024-03-12 ENCOUNTER — Encounter: Payer: Self-pay | Admitting: Oncology

## 2024-03-14 ENCOUNTER — Inpatient Hospital Stay

## 2024-03-20 ENCOUNTER — Inpatient Hospital Stay

## 2024-03-20 LAB — HEMOGLOBIN AND HEMATOCRIT (CANCER CENTER ONLY)
HCT: 37.8 % (ref 36.0–46.0)
Hemoglobin: 12.2 g/dL (ref 12.0–15.0)

## 2024-03-20 LAB — IRON AND TIBC
Iron: 66 ug/dL (ref 28–170)
Saturation Ratios: 28 % (ref 10.4–31.8)
TIBC: 232 ug/dL — ABNORMAL LOW (ref 250–450)
UIBC: 167 ug/dL

## 2024-03-20 LAB — FERRITIN: Ferritin: 179 ng/mL (ref 11–307)

## 2024-03-20 NOTE — Patient Instructions (Signed)

## 2024-03-20 NOTE — Progress Notes (Signed)
 Laurie [name] presents today for phlebotomy per MD orders. Phlebotomy procedure started at 1540 and ended at 1606. 250 mls removed. Patient tolerated procedure well. IV needle removed intact.

## 2024-03-21 ENCOUNTER — Ambulatory Visit: Payer: Self-pay | Admitting: Oncology

## 2024-03-29 ENCOUNTER — Encounter: Payer: Self-pay | Admitting: Oncology

## 2024-03-29 ENCOUNTER — Inpatient Hospital Stay: Attending: Oncology

## 2024-03-29 ENCOUNTER — Inpatient Hospital Stay

## 2024-03-29 LAB — CMP (CANCER CENTER ONLY)
ALT: 18 U/L (ref 0–44)
AST: 22 U/L (ref 15–41)
Albumin: 4.3 g/dL (ref 3.5–5.0)
Alkaline Phosphatase: 95 U/L (ref 38–126)
Anion gap: 9 (ref 5–15)
BUN: 18 mg/dL (ref 8–23)
CO2: 30 mmol/L (ref 22–32)
Calcium: 9.2 mg/dL (ref 8.9–10.3)
Chloride: 102 mmol/L (ref 98–111)
Creatinine: 0.62 mg/dL (ref 0.44–1.00)
GFR, Estimated: >60 mL/min
Glucose, Bld: 91 mg/dL (ref 70–99)
Potassium: 4.2 mmol/L (ref 3.5–5.1)
Sodium: 141 mmol/L (ref 135–145)
Total Bilirubin: 0.3 mg/dL (ref 0.0–1.2)
Total Protein: 6.5 g/dL (ref 6.5–8.1)

## 2024-03-29 LAB — HEMOGLOBIN AND HEMATOCRIT (CANCER CENTER ONLY)
HCT: 33.2 % — ABNORMAL LOW (ref 36.0–46.0)
Hemoglobin: 10.8 g/dL — ABNORMAL LOW (ref 12.0–15.0)

## 2024-03-29 LAB — FERRITIN: Ferritin: 102 ng/mL (ref 11–307)

## 2024-03-29 NOTE — Progress Notes (Signed)
 No phlebotomy today Hbg 10.8 below 11 per Dr. Melanee. Also pt will return in 2 weeks instead of one per Dr. Melanee.

## 2024-04-05 ENCOUNTER — Inpatient Hospital Stay

## 2024-04-12 ENCOUNTER — Inpatient Hospital Stay

## 2024-04-18 ENCOUNTER — Other Ambulatory Visit

## 2024-04-19 ENCOUNTER — Inpatient Hospital Stay

## 2024-04-26 ENCOUNTER — Inpatient Hospital Stay

## 2024-05-03 ENCOUNTER — Inpatient Hospital Stay

## 2024-05-10 ENCOUNTER — Inpatient Hospital Stay

## 2024-05-17 ENCOUNTER — Ambulatory Visit: Admitting: Oncology

## 2024-05-17 ENCOUNTER — Other Ambulatory Visit
